# Patient Record
Sex: Male | Born: 1955
Health system: Southern US, Community
[De-identification: ages and names within clinical notes are randomized; demographics above are authoritative.]

## PROBLEM LIST (undated history)

## (undated) DIAGNOSIS — I10 Essential (primary) hypertension: Secondary | ICD-10-CM

## (undated) HISTORY — PX: OTHER SURGICAL HISTORY: SHX169

## (undated) HISTORY — DX: Essential (primary) hypertension: I10

---

## 2002-12-05 ENCOUNTER — Ambulatory Visit (HOSPITAL_BASED_OUTPATIENT_CLINIC_OR_DEPARTMENT_OTHER): Admission: RE | Admit: 2002-12-05 | Discharge: 2002-12-05 | Payer: Self-pay | Admitting: Internal Medicine

## 2003-03-01 ENCOUNTER — Ambulatory Visit (HOSPITAL_BASED_OUTPATIENT_CLINIC_OR_DEPARTMENT_OTHER): Admission: RE | Admit: 2003-03-01 | Discharge: 2003-03-01 | Payer: Self-pay | Admitting: Plastic Surgery

## 2003-03-01 ENCOUNTER — Ambulatory Visit (HOSPITAL_COMMUNITY): Admission: RE | Admit: 2003-03-01 | Discharge: 2003-03-01 | Payer: Self-pay | Admitting: Plastic Surgery

## 2005-10-02 ENCOUNTER — Encounter: Admission: RE | Admit: 2005-10-02 | Discharge: 2005-12-31 | Payer: Self-pay | Admitting: Family Medicine

## 2012-04-21 ENCOUNTER — Ambulatory Visit (INDEPENDENT_AMBULATORY_CARE_PROVIDER_SITE_OTHER): Payer: Self-pay | Admitting: General Surgery

## 2012-05-20 ENCOUNTER — Ambulatory Visit (INDEPENDENT_AMBULATORY_CARE_PROVIDER_SITE_OTHER): Payer: Self-pay | Admitting: General Surgery

## 2012-06-24 ENCOUNTER — Ambulatory Visit (INDEPENDENT_AMBULATORY_CARE_PROVIDER_SITE_OTHER): Payer: Self-pay | Admitting: General Surgery

## 2012-08-22 ENCOUNTER — Ambulatory Visit (INDEPENDENT_AMBULATORY_CARE_PROVIDER_SITE_OTHER): Payer: Self-pay | Admitting: General Surgery

## 2012-10-19 ENCOUNTER — Ambulatory Visit (INDEPENDENT_AMBULATORY_CARE_PROVIDER_SITE_OTHER): Payer: Self-pay | Admitting: General Surgery

## 2012-10-19 ENCOUNTER — Encounter (INDEPENDENT_AMBULATORY_CARE_PROVIDER_SITE_OTHER): Payer: Self-pay | Admitting: General Surgery

## 2012-10-19 VITALS — BP 130/78 | HR 64 | Resp 14 | Ht 69.0 in | Wt 264.2 lb

## 2012-10-19 DIAGNOSIS — K429 Umbilical hernia without obstruction or gangrene: Secondary | ICD-10-CM

## 2012-10-19 NOTE — Progress Notes (Signed)
Patient ID: Chad Schneider, male   DOB: [birth year], 57 y.o.   MRN: [id number]  Chief Complaint  Patient presents with  . New Evaluation    eval umb hernia    HPI ETHELBERT Schneider is a 57 y.o. male.  Referred by Dr Freddrick March HPI This is a 57 year old male who is otherwise fairly healthy who presents after being referred by Dr. Freddrick March.  He has a two-year history of an enlarging umbilical mass that is now always out. The serious gotten increasingly larger as well as more tender over time. It is bothering him while he is working. He has no trouble with his bowel movements although he does have some abdominal pain from it. He is a prior colonoscopy in the last 10 years which he said some polyps but was otherwise normal. He has no nausea or vomiting. He comes in today to discuss surgery as he thinks it is getting worse. Past Medical History  Diagnosis Date  . Hypertension     Past Surgical History  Procedure Laterality Date  . Reset femur      History reviewed. No pertinent family history.  Social History History  Substance Use Topics  . Smoking status: Former Smoker    Types: Cigarettes    Quit date: 10/19/1977  . Smokeless tobacco: Never Used  . Alcohol Use: Yes    No Known Allergies  Current Outpatient Prescriptions  Medication Sig Dispense Refill  . co-enzyme Q-10 30 MG capsule Take 30 mg by mouth 3 (three) times daily.      . fish oil-omega-3 fatty acids 1000 MG capsule Take 2 g by mouth daily.      Chad Schneider Oil 1000 MG CAPS Take by mouth.      . lansoprazole (PREVACID) 15 MG capsule Take 15 mg by mouth daily.      Chad Schneider lisinopril (PRINIVIL,ZESTRIL) 10 MG tablet Take 10 mg by mouth daily.      . Vitamin D, Ergocalciferol, (DRISDOL) 50000 UNITS CAPS capsule Take 50,000 Units by mouth.       No current facility-administered medications for this visit.    Review of Systems Review of Systems  Constitutional: Negative for fever, chills and unexpected weight change.  HENT:  Negative for hearing loss, congestion, sore throat, trouble swallowing and voice change.   Eyes: Negative for visual disturbance.  Respiratory: Negative for cough and wheezing.   Cardiovascular: Negative for chest pain, palpitations and leg swelling.  Gastrointestinal: Negative for nausea, vomiting, abdominal pain, diarrhea, constipation, blood in stool, abdominal distention, anal bleeding and rectal pain.  Genitourinary: Negative for hematuria and difficulty urinating.  Musculoskeletal: Negative for arthralgias.  Skin: Negative for rash and wound.  Neurological: Negative for seizures, syncope, weakness and headaches.  Hematological: Negative for adenopathy. Does not bruise/bleed easily.  Psychiatric/Behavioral: Negative for confusion.    Blood pressure 130/78, pulse 64, resp. rate 14, height 5\' 9"  (1.753 m), weight 264 lb 3.2 oz (119.84 kg).  Physical Exam Physical Exam  Vitals reviewed. Constitutional: He appears well-developed and well-nourished.  Cardiovascular: Normal rate, regular rhythm and normal heart sounds.   Pulmonary/Chest: Effort normal and breath sounds normal. He has no wheezes.  Abdominal: Soft. Bowel sounds are normal. He exhibits no distension. There is no tenderness. A hernia (nonreducible mildly tender umbilical hernia) is present.      Assessment    Umbilical hernia     Plan    We discussed the options including an open versus a laparoscopic umbilical  hernia repair. I think that the best option for him to prevent recurrence due to his body habitus will be a laparoscopic umbilical hernia repair with a larger piece of mesh. I discussed the pros and cons of both an open versus a laparoscopic repair. We are going to end up doing a laparoscopic repair. We discussed the risks of this as well as the time he will need to be off of work. He would like to wait until he has a job that is completed. He would like to try wait until January I think this is most likely  reasonable. I gave him some warning signs to call sooner if he has any trouble before then otherwise we'll plan on fixing this in January. He will call me about a month before and it scheduled.       Chad Schneider 10/19/2012, 12:21 PM

## 2015-09-03 ENCOUNTER — Emergency Department (HOSPITAL_COMMUNITY): Payer: Self-pay

## 2015-09-03 ENCOUNTER — Encounter (HOSPITAL_COMMUNITY)
Admission: EM | Disposition: A | Discharge status: Home / Self Care | Payer: Self-pay | Source: Home / Self Care | Attending: Emergency Medicine

## 2015-09-03 ENCOUNTER — Observation Stay (HOSPITAL_COMMUNITY): Payer: Self-pay | Admitting: Certified Registered Nurse Anesthetist

## 2015-09-03 ENCOUNTER — Encounter (HOSPITAL_COMMUNITY): Payer: Self-pay | Admitting: Emergency Medicine

## 2015-09-03 ENCOUNTER — Observation Stay (HOSPITAL_COMMUNITY)
Admission: EM | Admit: 2015-09-03 | Discharge: 2015-09-04 | Disposition: A | Discharge status: Home / Self Care | Payer: Self-pay | Attending: Surgery | Admitting: Surgery

## 2015-09-03 DIAGNOSIS — K429 Umbilical hernia without obstruction or gangrene: Secondary | ICD-10-CM

## 2015-09-03 DIAGNOSIS — G4730 Sleep apnea, unspecified: Secondary | ICD-10-CM | POA: Insufficient documentation

## 2015-09-03 DIAGNOSIS — E669 Obesity, unspecified: Secondary | ICD-10-CM | POA: Insufficient documentation

## 2015-09-03 DIAGNOSIS — I10 Essential (primary) hypertension: Secondary | ICD-10-CM | POA: Insufficient documentation

## 2015-09-03 DIAGNOSIS — Z87442 Personal history of urinary calculi: Secondary | ICD-10-CM | POA: Insufficient documentation

## 2015-09-03 DIAGNOSIS — Z6839 Body mass index (BMI) 39.0-39.9, adult: Secondary | ICD-10-CM | POA: Insufficient documentation

## 2015-09-03 DIAGNOSIS — Z87891 Personal history of nicotine dependence: Secondary | ICD-10-CM | POA: Insufficient documentation

## 2015-09-03 DIAGNOSIS — K420 Umbilical hernia with obstruction, without gangrene: Principal | ICD-10-CM | POA: Diagnosis present

## 2015-09-03 DIAGNOSIS — K219 Gastro-esophageal reflux disease without esophagitis: Secondary | ICD-10-CM | POA: Insufficient documentation

## 2015-09-03 DIAGNOSIS — Z79899 Other long term (current) drug therapy: Secondary | ICD-10-CM | POA: Insufficient documentation

## 2015-09-03 HISTORY — PX: UMBILICAL HERNIA REPAIR: SHX196

## 2015-09-03 LAB — COMPREHENSIVE METABOLIC PANEL
ALT: 31 U/L (ref 17–63)
AST: 20 U/L (ref 15–41)
Albumin: 4.6 g/dL (ref 3.5–5.0)
Alkaline Phosphatase: 77 U/L (ref 38–126)
Anion gap: 8 (ref 5–15)
BILIRUBIN TOTAL: 0.7 mg/dL (ref 0.3–1.2)
BUN: 13 mg/dL (ref 6–20)
CHLORIDE: 106 mmol/L (ref 101–111)
CO2: 23 mmol/L (ref 22–32)
CREATININE: 0.87 mg/dL (ref 0.61–1.24)
Calcium: 9.2 mg/dL (ref 8.9–10.3)
Glucose, Bld: 110 mg/dL — ABNORMAL HIGH (ref 65–99)
POTASSIUM: 4.1 mmol/L (ref 3.5–5.1)
Sodium: 137 mmol/L (ref 135–145)
TOTAL PROTEIN: 7.8 g/dL (ref 6.5–8.1)

## 2015-09-03 LAB — URINALYSIS, ROUTINE W REFLEX MICROSCOPIC
Bilirubin Urine: NEGATIVE
GLUCOSE, UA: NEGATIVE mg/dL
HGB URINE DIPSTICK: NEGATIVE
KETONES UR: NEGATIVE mg/dL
Leukocytes, UA: NEGATIVE
Nitrite: NEGATIVE
PROTEIN: NEGATIVE mg/dL
Specific Gravity, Urine: 1.009 (ref 1.005–1.030)
pH: 5.5 (ref 5.0–8.0)

## 2015-09-03 LAB — CBC WITH DIFFERENTIAL/PLATELET
Basophils Absolute: 0.1 10*3/uL (ref 0.0–0.1)
Basophils Relative: 1 %
EOS PCT: 2 %
Eosinophils Absolute: 0.1 10*3/uL (ref 0.0–0.7)
HEMATOCRIT: 45.1 % (ref 39.0–52.0)
Hemoglobin: 15.1 g/dL (ref 13.0–17.0)
LYMPHS ABS: 2.2 10*3/uL (ref 0.7–4.0)
LYMPHS PCT: 31 %
MCH: 29.7 pg (ref 26.0–34.0)
MCHC: 33.5 g/dL (ref 30.0–36.0)
MCV: 88.8 fL (ref 78.0–100.0)
MONO ABS: 0.7 10*3/uL (ref 0.1–1.0)
Monocytes Relative: 9 %
Neutro Abs: 4.1 10*3/uL (ref 1.7–7.7)
Neutrophils Relative %: 57 %
PLATELETS: 204 10*3/uL (ref 150–400)
RBC: 5.08 MIL/uL (ref 4.22–5.81)
RDW: 14 % (ref 11.5–15.5)
WBC: 7.1 10*3/uL (ref 4.0–10.5)

## 2015-09-03 LAB — LACTIC ACID, PLASMA: LACTIC ACID, VENOUS: 1.3 mmol/L (ref 0.5–1.9)

## 2015-09-03 SURGERY — REPAIR, HERNIA, UMBILICAL, LAPAROSCOPIC
Anesthesia: General

## 2015-09-03 MED ORDER — ROCURONIUM BROMIDE 100 MG/10ML IV SOLN
INTRAVENOUS | Status: DC | PRN
  Administered 2015-09-03: 30 mg via INTRAVENOUS
  Administered 2015-09-03: 10 mg via INTRAVENOUS

## 2015-09-03 MED ORDER — BUPIVACAINE-EPINEPHRINE (PF) 0.25% -1:200000 IJ SOLN
INTRAMUSCULAR | Status: AC
  Filled 2015-09-03: qty 30

## 2015-09-03 MED ORDER — ONDANSETRON 4 MG PO TBDP: 4.0000 mg | ORAL_TABLET | Freq: Four times a day (QID) | ORAL | Status: DC | PRN

## 2015-09-03 MED ORDER — SODIUM CHLORIDE 0.9 % IJ SOLN
INTRAMUSCULAR | Status: AC
  Filled 2015-09-03: qty 10

## 2015-09-03 MED ORDER — DEXAMETHASONE SODIUM PHOSPHATE 10 MG/ML IJ SOLN
INTRAMUSCULAR | Status: DC | PRN
  Administered 2015-09-03: 10 mg via INTRAVENOUS

## 2015-09-03 MED ORDER — ONDANSETRON HCL 4 MG/2ML IJ SOLN
INTRAMUSCULAR | Status: AC
  Filled 2015-09-03: qty 2

## 2015-09-03 MED ORDER — ROCURONIUM BROMIDE 100 MG/10ML IV SOLN
INTRAVENOUS | Status: AC
  Filled 2015-09-03: qty 1

## 2015-09-03 MED ORDER — HYDROCODONE-ACETAMINOPHEN 5-325 MG PO TABS
1.0000 | ORAL_TABLET | ORAL | Status: DC | PRN
  Administered 2015-09-03 – 2015-09-04 (×3): 2 via ORAL
  Filled 2015-09-03 (×3): qty 2

## 2015-09-03 MED ORDER — PROPOFOL 10 MG/ML IV BOLUS
INTRAVENOUS | Status: AC
  Filled 2015-09-03: qty 20

## 2015-09-03 MED ORDER — ONDANSETRON HCL 4 MG/2ML IJ SOLN: 4.0000 mg | Freq: Four times a day (QID) | INTRAMUSCULAR | Status: DC | PRN

## 2015-09-03 MED ORDER — LIDOCAINE HCL (CARDIAC) 20 MG/ML IV SOLN
INTRAVENOUS | Status: DC | PRN
  Administered 2015-09-03: 100 mg via INTRAVENOUS

## 2015-09-03 MED ORDER — MIDAZOLAM HCL 2 MG/2ML IJ SOLN
INTRAMUSCULAR | Status: AC
  Filled 2015-09-03: qty 2

## 2015-09-03 MED ORDER — LACTATED RINGERS IV SOLN
INTRAVENOUS | Status: DC | PRN
  Administered 2015-09-03 (×2): via INTRAVENOUS

## 2015-09-03 MED ORDER — SUGAMMADEX SODIUM 200 MG/2ML IV SOLN
INTRAVENOUS | Status: AC
  Filled 2015-09-03: qty 2

## 2015-09-03 MED ORDER — SODIUM CHLORIDE 0.9 % IV SOLN
INTRAVENOUS | Status: DC
  Administered 2015-09-03: 17:00:00 via INTRAVENOUS

## 2015-09-03 MED ORDER — CEFAZOLIN SODIUM-DEXTROSE 2-4 GM/100ML-% IV SOLN
INTRAVENOUS | Status: AC
  Filled 2015-09-03: qty 100

## 2015-09-03 MED ORDER — CEFAZOLIN SODIUM-DEXTROSE 2-4 GM/100ML-% IV SOLN
2.0000 g | INTRAVENOUS | Status: AC
  Filled 2015-09-03: qty 100

## 2015-09-03 MED ORDER — LISINOPRIL 20 MG PO TABS
40.0000 mg | ORAL_TABLET | Freq: Every day | ORAL | Status: DC
  Administered 2015-09-04: 40 mg via ORAL
  Filled 2015-09-03: qty 2

## 2015-09-03 MED ORDER — CEFAZOLIN SODIUM-DEXTROSE 2-4 GM/100ML-% IV SOLN
2.0000 g | INTRAVENOUS | Status: DC
  Administered 2015-09-03: 2 g via INTRAVENOUS

## 2015-09-03 MED ORDER — 0.9 % SODIUM CHLORIDE (POUR BTL) OPTIME
TOPICAL | Status: DC | PRN
  Administered 2015-09-03: 1000 mL

## 2015-09-03 MED ORDER — SUCCINYLCHOLINE CHLORIDE 20 MG/ML IJ SOLN
INTRAMUSCULAR | Status: DC | PRN
  Administered 2015-09-03: 200 mg via INTRAVENOUS

## 2015-09-03 MED ORDER — FENTANYL CITRATE (PF) 100 MCG/2ML IJ SOLN
INTRAMUSCULAR | Status: DC | PRN
  Administered 2015-09-03: 50 ug via INTRAVENOUS
  Administered 2015-09-03: 100 ug via INTRAVENOUS
  Administered 2015-09-03 (×2): 50 ug via INTRAVENOUS

## 2015-09-03 MED ORDER — PROMETHAZINE HCL 25 MG/ML IJ SOLN: 12.5000 mg | Freq: Once | INTRAMUSCULAR | Status: DC | PRN

## 2015-09-03 MED ORDER — MIDAZOLAM HCL 5 MG/5ML IJ SOLN
INTRAMUSCULAR | Status: DC | PRN
  Administered 2015-09-03: 2 mg via INTRAVENOUS

## 2015-09-03 MED ORDER — PROPOFOL 10 MG/ML IV BOLUS
INTRAVENOUS | Status: DC | PRN
  Administered 2015-09-03: 200 mg via INTRAVENOUS

## 2015-09-03 MED ORDER — DEXAMETHASONE SODIUM PHOSPHATE 10 MG/ML IJ SOLN
INTRAMUSCULAR | Status: AC
  Filled 2015-09-03: qty 1

## 2015-09-03 MED ORDER — FENTANYL CITRATE (PF) 250 MCG/5ML IJ SOLN
INTRAMUSCULAR | Status: AC
  Filled 2015-09-03: qty 5

## 2015-09-03 MED ORDER — FENTANYL CITRATE (PF) 100 MCG/2ML IJ SOLN
INTRAMUSCULAR | Status: AC
  Filled 2015-09-03: qty 2

## 2015-09-03 MED ORDER — LACTATED RINGERS IV SOLN: INTRAVENOUS | Status: DC

## 2015-09-03 MED ORDER — DIPHENHYDRAMINE HCL 50 MG/ML IJ SOLN: 12.5000 mg | Freq: Four times a day (QID) | INTRAMUSCULAR | Status: DC | PRN

## 2015-09-03 MED ORDER — SUGAMMADEX SODIUM 200 MG/2ML IV SOLN
INTRAVENOUS | Status: DC | PRN
  Administered 2015-09-03: 300 mg via INTRAVENOUS

## 2015-09-03 MED ORDER — EPHEDRINE SULFATE 50 MG/ML IJ SOLN
INTRAMUSCULAR | Status: AC
  Filled 2015-09-03: qty 1

## 2015-09-03 MED ORDER — MORPHINE SULFATE (PF) 2 MG/ML IV SOLN: 2.0000 mg | INTRAVENOUS | Status: DC | PRN

## 2015-09-03 MED ORDER — ONDANSETRON HCL 4 MG/2ML IJ SOLN
INTRAMUSCULAR | Status: DC | PRN
  Administered 2015-09-03: 4 mg via INTRAVENOUS

## 2015-09-03 MED ORDER — FAMOTIDINE IN NACL 20-0.9 MG/50ML-% IV SOLN
20.0000 mg | Freq: Two times a day (BID) | INTRAVENOUS | Status: DC
  Administered 2015-09-03 (×2): 20 mg via INTRAVENOUS
  Filled 2015-09-03 (×4): qty 50

## 2015-09-03 MED ORDER — METOPROLOL SUCCINATE ER 25 MG PO TB24: 25.0000 mg | ORAL_TABLET | Freq: Every day | ORAL | Status: DC

## 2015-09-03 MED ORDER — LIDOCAINE HCL (CARDIAC) 20 MG/ML IV SOLN
INTRAVENOUS | Status: AC
  Filled 2015-09-03: qty 5

## 2015-09-03 MED ORDER — FENTANYL CITRATE (PF) 100 MCG/2ML IJ SOLN
25.0000 ug | INTRAMUSCULAR | Status: DC | PRN
Start: 2015-09-03 — End: 2015-09-03
  Administered 2015-09-03 (×2): 50 ug via INTRAVENOUS

## 2015-09-03 MED ORDER — BUPIVACAINE-EPINEPHRINE 0.25% -1:200000 IJ SOLN
INTRAMUSCULAR | Status: DC | PRN
  Administered 2015-09-03: 30 mL

## 2015-09-03 MED ORDER — DIPHENHYDRAMINE HCL 12.5 MG/5ML PO ELIX: 12.5000 mg | ORAL_SOLUTION | Freq: Four times a day (QID) | ORAL | Status: DC | PRN

## 2015-09-03 SURGICAL SUPPLY — 44 items
APPLIER CLIP 5 13 M/L LIGAMAX5 (MISCELLANEOUS)
APPLIER CLIP ROT 10 11.4 M/L (STAPLE)
BINDER ABDOMINAL 12 ML 46-62 (SOFTGOODS) ×2 IMPLANT
CABLE HIGH FREQUENCY MONO STRZ (ELECTRODE) ×2 IMPLANT
CHLORAPREP W/TINT 26ML (MISCELLANEOUS) ×2 IMPLANT
CLIP APPLIE 5 13 M/L LIGAMAX5 (MISCELLANEOUS) IMPLANT
CLIP APPLIE ROT 10 11.4 M/L (STAPLE) IMPLANT
COVER SURGICAL LIGHT HANDLE (MISCELLANEOUS) ×2 IMPLANT
DECANTER SPIKE VIAL GLASS SM (MISCELLANEOUS) ×2 IMPLANT
DEVICE SECURE STRAP 25 ABSORB (INSTRUMENTS) IMPLANT
DEVICE TROCAR PUNCTURE CLOSURE (ENDOMECHANICALS) ×2 IMPLANT
DRAPE INCISE IOBAN 66X45 STRL (DRAPES) ×2 IMPLANT
DRAPE LAPAROSCOPIC ABDOMINAL (DRAPES) ×2 IMPLANT
ELECT COATED BLADE 2.86 ST (ELECTRODE) ×2 IMPLANT
ELECT REM PT RETURN 9FT ADLT (ELECTROSURGICAL) ×2
ELECTRODE REM PT RTRN 9FT ADLT (ELECTROSURGICAL) ×1 IMPLANT
GAUZE SPONGE 4X4 12PLY STRL (GAUZE/BANDAGES/DRESSINGS) ×2 IMPLANT
GLOVE SURG SIGNA 7.5 PF LTX (GLOVE) ×2 IMPLANT
GOWN STRL REUS W/TWL XL LVL3 (GOWN DISPOSABLE) ×6 IMPLANT
IRRIG SUCT STRYKERFLOW 2 WTIP (MISCELLANEOUS)
IRRIGATION SUCT STRKRFLW 2 WTP (MISCELLANEOUS) IMPLANT
KIT BASIN OR (CUSTOM PROCEDURE TRAY) ×2 IMPLANT
LIQUID BAND (GAUZE/BANDAGES/DRESSINGS) ×2 IMPLANT
NEEDLE SPNL 22GX3.5 QUINCKE BK (NEEDLE) ×2 IMPLANT
PAD POSITIONING PINK XL (MISCELLANEOUS) IMPLANT
POSITIONER SURGICAL ARM (MISCELLANEOUS) IMPLANT
SCISSORS LAP 5X35 DISP (ENDOMECHANICALS) ×2 IMPLANT
SHEARS HARMONIC ACE PLUS 36CM (ENDOMECHANICALS) IMPLANT
SLEEVE XCEL OPT CAN 5 100 (ENDOMECHANICALS) ×2 IMPLANT
STRIP CLOSURE SKIN 1/2X4 (GAUZE/BANDAGES/DRESSINGS) IMPLANT
SUT MNCRL AB 4-0 PS2 18 (SUTURE) ×2 IMPLANT
SUT NOVA NAB GS-21 0 18 T12 DT (SUTURE) ×4 IMPLANT
SUT NOVA T20/GS 25 (SUTURE) IMPLANT
SUT VIC AB 3-0 SH 18 (SUTURE) ×4 IMPLANT
TAPE CLOTH 3X10 WHT NS LF (GAUZE/BANDAGES/DRESSINGS) ×2 IMPLANT
TAPE CLOTH 4X10 WHT NS (GAUZE/BANDAGES/DRESSINGS) IMPLANT
TOWEL OR 17X26 10 PK STRL BLUE (TOWEL DISPOSABLE) ×2 IMPLANT
TOWEL OR NON WOVEN STRL DISP B (DISPOSABLE) ×2 IMPLANT
TRAY FOLEY W/METER SILVER 14FR (SET/KITS/TRAYS/PACK) IMPLANT
TRAY FOLEY W/METER SILVER 16FR (SET/KITS/TRAYS/PACK) IMPLANT
TRAY LAPAROSCOPIC (CUSTOM PROCEDURE TRAY) ×2 IMPLANT
TROCAR BLADELESS OPT 5 100 (ENDOMECHANICALS) ×2 IMPLANT
TROCAR XCEL NON-BLD 11X100MML (ENDOMECHANICALS) ×2 IMPLANT
TUBING INSUF HEATED (TUBING) ×2 IMPLANT

## 2015-09-03 NOTE — Anesthesia Postprocedure Evaluation (Signed)
Anesthesia Post Note  Patient: Chad Schneider  Procedure(s) Performed: Procedure(s) (LRB): DIAGNOSTIC LAPOROSCOPY,OPEN UMBILICAL HERNIA   REPAIR (N/A)  Patient location during evaluation: PACU Anesthesia Type: General Level of consciousness: awake and alert Pain management: pain level controlled Vital Signs Assessment: post-procedure vital signs reviewed and stable Respiratory status: spontaneous breathing, nonlabored ventilation and respiratory function stable Cardiovascular status: blood pressure returned to baseline and stable Postop Assessment: no signs of nausea or vomiting Anesthetic complications: no    Last Vitals:  Vitals:   09/03/15 1536 09/03/15 1546  BP:  (!) 160/87  Pulse: 85 85  Resp: 11 14  Temp:  36.7 C    Last Pain:  Vitals:   09/03/15 1520  TempSrc:   PainSc: 4                  Linton RumpJennifer Dickerson Kijana Estock

## 2015-09-03 NOTE — Anesthesia Preprocedure Evaluation (Addendum)
Anesthesia Evaluation  Patient identified by MRN, date of birth, ID band Patient awake    Reviewed: Allergy & Precautions, NPO status , Patient's Chart, lab work & pertinent test results  History of Anesthesia Complications Negative for: history of anesthetic complications  Airway Mallampati: II  TM Distance: >3 FB Neck ROM: Full    Dental  (+) Teeth Intact, Dental Advisory Given   Pulmonary neg shortness of breath, neg sleep apnea, neg COPD, neg recent URI, former smoker,    Pulmonary exam normal breath sounds clear to auscultation       Cardiovascular hypertension, Pt. on medications (-) angina(-) Past MI, (-) Cardiac Stents and (-) CHF  Rhythm:Regular Rate:Normal     Neuro/Psych negative neurological ROS     GI/Hepatic Neg liver ROS, GERD  Controlled,  Endo/Other  negative endocrine ROS  Renal/GU negative Renal ROS     Musculoskeletal   Abdominal (+) + obese,   Peds  Hematology negative hematology ROS (+)   Anesthesia Other Findings Incarcerated hernia  Reproductive/Obstetrics                           Anesthesia Physical Anesthesia Plan  ASA: II  Anesthesia Plan: General   Post-op Pain Management:    Induction: Intravenous  Airway Management Planned: Oral ETT  Additional Equipment:   Intra-op Plan:   Post-operative Plan: Extubation in OR  Informed Consent: I have reviewed the patients History and Physical, chart, labs and discussed the procedure including the risks, benefits and alternatives for the proposed anesthesia with the patient or authorized representative who has indicated his/her understanding and acceptance.   Dental advisory given  Plan Discussed with:   Anesthesia Plan Comments:       Anesthesia Quick Evaluation

## 2015-09-03 NOTE — H&P (Signed)
Chad Schneider is an 60 y.o. male.   PCP: Cari Caraway, MD  Chief Complaint: Umbilical hernia HPI: Patient is a 60 year old gentleman has had umbilical hernia for years he was actually evaluated by Dr. Donne Hazel 10/19/2012. Patient's been able to reduce this hernia will over the interim. Sunday 08/01/15 hernia appeared and was not reducible. He developed some nausea low-grade fever and diarrhea. This resolved on its own. He went to work yesterday and work throughout the day. The hernia  persists and he is still unable to reduce it. He presented to the emergency room for evaluation today. Workup in the ED shows he is afebrile blood pressure slightly elevated. Labs are normal.   Films show no evidence of bowel obstruction or free air there is no acute cardiopulmonary issues. The umbilical hernia is slightly erythematous, hard, minimally tender, and cannot be reduced by manual manipulation.                                                                                                                      Past Medical History:  Diagnosis Date  . Hypertension     Past Surgical History:  Procedure Laterality Date  . reset femur      No family history on file. Social History:  reports that he quit smoking about 37 years ago. His smoking use included Cigarettes. He has never used smokeless tobacco. He reports that he drinks alcohol. He reports that he does not use drugs. Tobacco 11 years quit in 1979.  EtOH: Social  Drugs: None since the teenage days.  Patient is single and lives alone. 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  Allergies: No Known Allergies  Prior to Admission medications   Medication Sig Start Date End Date Taking? Authorizing Provider  Cinnamon 500 MG capsule Take 500 mg by mouth daily.   Yes Historical Provider, MD  hydrochlorothiazide (MICROZIDE) 12.5 MG capsule Take 12.5 mg by mouth daily.   Yes Historical Provider, MD  Javier Docker Oil 1000 MG CAPS Take 1,000  mg by mouth daily.    Yes Historical Provider, MD  lansoprazole (PREVACID) 15 MG capsule Take 15 mg by mouth daily.   Yes Historical Provider, MD  lisinopril (PRINIVIL,ZESTRIL) 40 MG tablet Take 40 mg by mouth daily.   Yes Historical Provider, MD  metoprolol succinate (TOPROL-XL) 25 MG 24 hr tablet Take 25 mg by mouth at bedtime.   Yes Historical Provider, MD  Multiple Vitamins-Minerals (MULTIVITAMIN ADULT) TABS Take 1 tablet by mouth daily.   Yes Historical Provider, MD     Results for orders placed or performed during the hospital encounter of 09/03/15 (from the past 48 hour(s))  Urinalysis, Routine w reflex microscopic (not at [facility])     Status: None   Collection Time: 09/03/15 10:50 AM  Result Value Ref Range   Color, Urine YELLOW YELLOW   APPearance CLEAR CLEAR   Specific Gravity, Urine 1.009 1.005 - 1.030   pH 5.5 5.0 - 8.0   Glucose,  UA NEGATIVE NEGATIVE mg/dL   Hgb urine dipstick NEGATIVE NEGATIVE   Bilirubin Urine NEGATIVE NEGATIVE   Ketones, ur NEGATIVE NEGATIVE mg/dL   Protein, ur NEGATIVE NEGATIVE mg/dL   Nitrite NEGATIVE NEGATIVE   Leukocytes, UA NEGATIVE NEGATIVE    Comment: MICROSCOPIC NOT DONE ON URINES WITH NEGATIVE PROTEIN, BLOOD, LEUKOCYTES, NITRITE, OR GLUCOSE <1000 mg/dL.  CBC with Differential/Platelet     Status: None   Collection Time: 09/03/15 11:39 AM  Result Value Ref Range   WBC 7.1 4.0 - 10.5 K/uL   RBC 5.08 4.22 - 5.81 MIL/uL   Hemoglobin 15.1 13.0 - 17.0 g/dL   HCT 45.1 39.0 - 52.0 %   MCV 88.8 78.0 - 100.0 fL   MCH 29.7 26.0 - 34.0 pg   MCHC 33.5 30.0 - 36.0 g/dL   RDW 14.0 11.5 - 15.5 %   Platelets 204 150 - 400 K/uL   Neutrophils Relative % 57 %   Neutro Abs 4.1 1.7 - 7.7 K/uL   Lymphocytes Relative 31 %   Lymphs Abs 2.2 0.7 - 4.0 K/uL   Monocytes Relative 9 %   Monocytes Absolute 0.7 0.1 - 1.0 K/uL   Eosinophils Relative 2 %   Eosinophils Absolute 0.1 0.0 - 0.7 K/uL   Basophils Relative 1 %   Basophils Absolute 0.1 0.0 - 0.1 K/uL   Comprehensive metabolic panel     Status: Abnormal   Collection Time: 09/03/15 11:39 AM  Result Value Ref Range   Sodium 137 135 - 145 mmol/L   Potassium 4.1 3.5 - 5.1 mmol/L   Chloride 106 101 - 111 mmol/L   CO2 23 22 - 32 mmol/L   Glucose, Bld 110 (H) 65 - 99 mg/dL   BUN 13 6 - 20 mg/dL   Creatinine, Ser 0.87 0.61 - 1.24 mg/dL   Calcium 9.2 8.9 - 10.3 mg/dL   Total Protein 7.8 6.5 - 8.1 g/dL   Albumin 4.6 3.5 - 5.0 g/dL   AST 20 15 - 41 U/L   ALT 31 17 - 63 U/L   Alkaline Phosphatase 77 38 - 126 U/L   Total Bilirubin 0.7 0.3 - 1.2 mg/dL   GFR calc non Af Amer >60 >60 mL/min   GFR calc Af Amer >60 >60 mL/min    Comment: (NOTE) The eGFR has been calculated using the CKD EPI equation. This calculation has not been validated in all clinical situations. eGFR's persistently <60 mL/min signify possible Chronic Kidney Disease.    Anion gap 8 5 - 15  Lactic acid, plasma     Status: None   Collection Time: 09/03/15 11:39 AM  Result Value Ref Range   Lactic Acid, Venous 1.3 0.5 - 1.9 mmol/L   Dg Abdomen Acute W/chest  Result Date: 09/03/2015 CLINICAL DATA:  Pt has umbilical hernia. Lower abd pain. Causing pain for over a week. Nausea and almost vomited 2 days ago. No chest pains or SOB. No coughing. Pt takes HTN meds. Not diabetic. Quit smoking in 1979. Smoked only for a few years as a teenager. EXAM: DG ABDOMEN ACUTE W/ 1V CHEST COMPARISON:  None. FINDINGS: Normal bowel gas pattern.  No free air. No evidence of renal or ureteral stones. Cardiac silhouette is normal in size. Normal mediastinal and hilar contours. Clear lungs. Skeletal structures are demineralized. There are degenerative changes throughout the thoracolumbar spine. IMPRESSION: 1. No acute findings.  No evidence bowel obstruction or free air. 2. No acute cardiopulmonary disease. Electronically Signed   By: Shanon Brow  Ormond M.D.   On: 09/03/2015 11:50    Review of Systems  Constitutional: Negative.   HENT: Positive for  congestion (Occasional).   Eyes: Negative.   Respiratory: Negative for cough, hemoptysis, sputum production, shortness of breath and wheezing.        He has sleep apnea has been on CPAP for 17 years  Cardiovascular: Negative.   Gastrointestinal: Positive for abdominal pain (Umbilical site), diarrhea (He has some on Sunday 76/17), heartburn (He takes Prevacid daily for this.) and nausea (He had nausea on Sunday but no vomiting yesterday he went to work issues.). Negative for blood in stool, constipation, melena and vomiting.  Genitourinary: Negative.   Musculoskeletal: Negative.   Skin:       Umbilical hernia that is normally reducible has been hard and has not been able to reduce it since Sunday 08/01/15.  Neurological: Negative.   Endo/Heme/Allergies: Negative.   Psychiatric/Behavioral: Negative.     Blood pressure 136/99, pulse 69, temperature 98.6 F (37 C), temperature source Oral, resp. rate 20, SpO2 95 %. Physical Exam  Constitutional: He is oriented to person, place, and time. He appears well-developed and well-nourished. No distress.  69 inches/294 pounds  BMI is 39  HENT:  Head: Normocephalic.  Mouth/Throat: Oropharynx is clear and moist. No oropharyngeal exudate.  Eyes: Right eye exhibits no discharge. Left eye exhibits no discharge. No scleral icterus.  Neck: Normal range of motion. Neck supple. No JVD present. No tracheal deviation present. No thyromegaly present.  Cardiovascular: Normal rate, regular rhythm, normal heart sounds and intact distal pulses.   No murmur heard. Respiratory: Effort normal and breath sounds normal. No respiratory distress. He has no wheezes. He has no rales. He exhibits no tenderness.  GI: Soft. Bowel sounds are normal. He exhibits no distension and no mass. There is tenderness (Umbilical site). There is no rebound and no guarding.  4 x 4's 5 cm umbilical hernia is erythematous and indurated. He is not overly tender. He does not have peritonitis.   Musculoskeletal: He exhibits no edema or tenderness.  Lymphadenopathy:    He has no cervical adenopathy.  Neurological: He is alert and oriented to person, place, and time. No cranial nerve deficit.  Skin: Skin is dry. No rash noted. He is not diaphoretic. No erythema. No pallor.  Psychiatric: He has a normal mood and affect. His behavior is normal. Judgment and thought content normal.     Assessment/Plan Umbilical hernia, with probable omental incarceration. Hypertension Sleep apnea on CPAP 17 years BMI 39 Remote history of nephrolithiasis  Plan: Patient been seen and evaluated by Dr. Lucia Gaskins he plans to the operating room for umbilical hernia repair. He thinks he has some omentum within the hernia currently. They discussed options for a straight suture repair versus mesh. Dr. determine that at the time of surgery. Patient understands is agreeable with Dr. Pollie Friar recommendation.  JENNINGS,WILLARD, PA-C 09/03/2015, 12:20 PM   Agree with above. To the OR for umbilical hernia.  I discussed the indications and complications of hernia surgery with the patient.  I discussed both the laparoscopic and open approach to hernia repair..  The potential risks of hernia surgery include, but are not limited to, bleeding, infection, open surgery, nerve injury, and recurrence of the hernia.   Because the umbilicus is red and inflamed, I am unlikely to place permanent mesh.  Alphonsa Overall, MD, [facility] Pager: [phone] Office phone:  [phone]

## 2015-09-03 NOTE — ED Triage Notes (Signed)
Pt c/o umbilical hernia x several years, worsened this past week, new onset nausea, emesis, and increased size. In past patient was able to manually reduce hernia himself, now cannot. Hernia is hot, bulging, tender, non-reducible.

## 2015-09-03 NOTE — ED Notes (Signed)
Security at bedside for inventory of valuables

## 2015-09-03 NOTE — ED Notes (Signed)
Unable to collect labs at this time patient is not in the room 

## 2015-09-03 NOTE — Op Note (Signed)
09/03/2015  2:47 PM  PATIENT:  Chad Schneider, 60 y.o., male, MRN: [id number]  PREOP DIAGNOSIS:  incarcerated umbilical hernia   POSTOP DIAGNOSIS:   Incarcerated umbilical hernia with infarcted omentum.  PROCEDURE:   Procedure(s):  DIAGNOSTIC LAPOROSCOPY,OPEN UMBILICAL HERNIA  suture REPAIR  SURGEON:   Ovidio Kinavid Kelly Ranieri, M.D.  ASSISTANT:   NOne  ANESTHESIA:   general  Anesthesiologist: Linton RumpJennifer Dickerson Allan, MD CRNA: Minerva EndsAngela M Mirarchi, CRNA; Delphia GratesLoraine Chandler, CRNA  General  EBL:  minimal  ml  BLOOD ADMINISTERED: none  DRAINS: none   LOCAL MEDICATIONS USED:   30 cc 1/4% marcaine  SPECIMEN:   Umbilical hernia sac  COUNTS CORRECT:  YES  INDICATIONS FOR PROCEDURE:  Chad Schneider is a 60 y.o. (DOB: 06-14-1955) white male whose primary care physician is MCNEILL,WENDY, MD and comes for repair of incarcerated umbilical hernia.   [facility].  He now comes for repair.  I discussed both laparoscopic and open approaches.   The indications and risks of the surgery were explained to the patient.  The risks include, but are not limited to, infection, bleeding, and nerve injury.  Note dictated to:     The patient was taken [facility]. He was given a general endotracheal anesthetic. His abdomen was shaved and prepped and Ioban drape applied.   A timeout was held and surgical checklist run.   The hernia was not reducible, even with the patient asleep. I started out laparoscopically accessing the abdominal cavity with a 5 mm Ethicon trocar in the left upper quadrant. I placed a second 5 mm trocar in the left lateral abdomen.   I did an exploration which showed normal liver, stomach, and bowel that could be seen. He had a portion of his transverse colon omentum incarcerated in the umbilical hernia. I amputated this omentum with cautery.   Because of the redness around the incarcerated umbilical hernia and  the apparent necrosis of the omentum, I thought unwise to use permanent mesh for this hernia repair.    Therefore made a smiling infraumbilical incision with sharp dissection carried down to the abdominal wall fascia. I raised the skin of the umbilicus off the hernia sac the hernia mass was about 4 cm in diameter, with the neck of the hernia at about 2 cm.   I indicated the hernia sac, tied off the bleeders with 3-0 Vicryl suture, started the fascial closure. I used 0 Novafil and interrupted fashion to close the 2 cm fascial defect.   I then re-laparoscoped the patient to evaluate hernia repair was look good. There was no evidence of bowel injury. The trochars were then removed in turn. The subcutaneous tissues at the umbilicus were closed with 3-0 Vicryl suture. The skin was closed with a 4-0 Monocryl suture. The wounds were painted with with liquid band. A sterile pressure dressing was applied over the wounds and the patient was sent to the PACU with an abdominal binder.   His final sponge and needle count were correct.   Ovidio Kinavid Leyton Magoon, MD, [facility] Pager: [phone] Office phone:  [phone]

## 2015-09-03 NOTE — Anesthesia Procedure Notes (Signed)
Procedure Name: Intubation Date/Time: 09/03/2015 1:37 PM Performed by: Delphia GratesHANDLER, Sheyanne Munley Pre-anesthesia Checklist: Patient identified, Emergency Drugs available, Suction available and Patient being monitored Patient Re-evaluated:Patient Re-evaluated prior to inductionOxygen Delivery Method: Circle system utilized Preoxygenation: Pre-oxygenation with 100% oxygen Intubation Type: Cricoid Pressure applied and Rapid sequence Laryngoscope Size: Mac and 4 Grade View: Grade III Tube type: Oral Tube size: 7.5 mm Number of attempts: 1 Airway Equipment and Method: Bougie stylet Placement Confirmation: ETT inserted through vocal cords under direct vision,  breath sounds checked- equal and bilateral and positive ETCO2 Secured at: 22 cm Tube secured with: Tape Dental Injury: Teeth and Oropharynx as per pre-operative assessment  Comments: Glidescope available. Bougie passed easily.

## 2015-09-03 NOTE — ED Notes (Signed)
Patient transported to X-ray 

## 2015-09-03 NOTE — ED Provider Notes (Signed)
[facility]-EMERGENCY DEPT Provider Note   CSN: [id number] Arrival date & time: 09/03/15  4098  First Provider Contact:  None       History   Chief Complaint Chief Complaint  Patient presents with  . Hernia    HPI Chad Schneider is a 60 y.o. male.  HPI Pt c/o umbilical hernia x several years, worsened this past week, new onset nausea, emesis, and increased size. In past patient was able to manually reduce hernia himself, now cannot. Hernia is hot, bulging, tender, non-reducible.   Past Medical History:  Diagnosis Date  . Hypertension     There are no active problems to display for this patient.   Past Surgical History:  Procedure Laterality Date  . reset femur         Home Medications    Prior to Admission medications   Medication Sig Start Date End Date Taking? Authorizing Provider  lisinopril (PRINIVIL,ZESTRIL) 40 MG tablet Take 40 mg by mouth daily.   Yes Historical Provider, MD  Multiple Vitamins-Minerals (MULTIVITAMIN ADULT) TABS Take 1 tablet by mouth daily.   Yes Historical Provider, MD  co-enzyme Q-10 30 MG capsule Take 30 mg by mouth 3 (three) times daily.    Historical Provider, MD  fish oil-omega-3 fatty acids 1000 MG capsule Take 2 g by mouth daily.    Historical Provider, MD  [name] 1000 MG CAPS Take by mouth.    Historical Provider, MD  lansoprazole (PREVACID) 15 MG capsule Take 15 mg by mouth daily.    Historical Provider, MD    Family History No family history on file.  Social History Social History  Substance Use Topics  . Smoking status: Former Smoker    Types: Cigarettes    Quit date: 10/19/1977  . Smokeless tobacco: Never Used  . Alcohol use Yes     Allergies   Review of patient's allergies indicates no known allergies.   Review of Systems Review of Systems All other systems reviewed and are negative  Physical Exam Updated Vital Signs BP 136/99   Pulse 69   Temp 98.6 F (37 C) (Oral)   Resp 20   SpO2 95%   Physical  Exam  Physical Exam  Nursing note and vitals reviewed. Constitutional: He is oriented to person, place, and time. He appears well-developed and well-nourished. No distress.  HENT:  Head: Normocephalic and atraumatic.  Eyes: Pupils are equal, round, and reactive to light.  Neck: Normal range of motion.  Cardiovascular: Normal rate and intact distal pulses.   Pulmonary/Chest: No respiratory distress.  Abdominal: Normal appearance.  Patient with incarcerated umbilical hernia.  Attempt to reduce the hernia was unsuccessful.  Bowel sounds are present on auscultation.  No rebound or guarding tenderness present. Musculoskeletal: Normal range of motion.  Neurological: He is alert and oriented to person, place, and time. No cranial nerve deficit.  Skin: Skin is warm and dry. No rash noted.  Psychiatric: He has a normal mood and affect. His behavior is normal.   ED Treatments / Results  Labs (all labs ordered are listed, but only abnormal results are displayed) Labs Reviewed  CBC WITH DIFFERENTIAL/PLATELET  URINALYSIS, ROUTINE W REFLEX MICROSCOPIC (NOT AT [facility])  COMPREHENSIVE METABOLIC PANEL  LACTIC ACID, PLASMA    EKG  EKG Interpretation None       Radiology Dg Abdomen Acute W/chest  Result Date: 09/03/2015 CLINICAL DATA:  Pt has umbilical hernia. Lower abd pain. Causing pain for over a week. Nausea and almost vomited  2 days ago. No chest pains or SOB. No coughing. Pt takes HTN meds. Not diabetic. Quit smoking in 1979. Smoked only for a few years as a teenager. EXAM: DG ABDOMEN ACUTE W/ 1V CHEST COMPARISON:  None. FINDINGS: Normal bowel gas pattern.  No free air. No evidence of renal or ureteral stones. Cardiac silhouette is normal in size. Normal mediastinal and hilar contours. Clear lungs. Skeletal structures are demineralized. There are degenerative changes throughout the thoracolumbar spine. IMPRESSION: 1. No acute findings.  No evidence bowel obstruction or free air. 2. No acute  cardiopulmonary disease. Electronically Signed   By: Amie Portlandavid  Ormond M.D.   On: 09/03/2015 11:50    Procedures Procedures (including critical care time)  Medications Ordered in ED Medications - No data to display   Initial Impression / Assessment and Plan / ED Course  I have reviewed the triage vital signs and the nursing notes.  Pertinent labs & imaging results that were available during my care of the patient were reviewed by me and considered in my medical decision making (see chart for details).  Clinical Course   Patient was seen and evaluated by general surgery.  Plan section the operating room for definitive repair and treatment.   Final Clinical Impressions(s) / ED Diagnoses   Final diagnoses:  Recurrent umbilical hernia    New Prescriptions New Prescriptions   No medications on file     Chad Nayobert Anyae Griffith, MD 09/03/15 1212

## 2015-09-03 NOTE — Progress Notes (Signed)
RT placed patient on CPAP. Patient home setting is 11 cmH2O. Sterile water was added to water chamber for humidification. Patient is tolerating well. RT will continue to monitor.

## 2015-09-03 NOTE — ED Notes (Signed)
General Surgery at bedside.

## 2015-09-03 NOTE — ED Notes (Signed)
Consent completed at bedside by Dr. Ezzard StandingNewman  NT beginning CHG BATH

## 2015-09-03 NOTE — Transfer of Care (Signed)
Immediate Anesthesia Transfer of Care Note  Patient: Chad Schneider  Procedure(s) Performed: Procedure(s): DIAGNOSTIC LAPOROSCOPY,OPEN UMBILICAL HERNIA   REPAIR (N/A)  Patient Location: PACU  Anesthesia Type:General  Level of Consciousness: sedated  Airway & Oxygen Therapy: Patient Spontanous Breathing and Patient connected to face mask oxygen  Post-op Assessment: Report given to RN and Post -op Vital signs reviewed and stable  Post vital signs: Reviewed and stable  Last Vitals:  Vitals:   09/03/15 1108 09/03/15 1144  BP: 151/98 136/99  Pulse: 62 69  Resp: 20 20  Temp:      Last Pain:  Vitals:   09/03/15 0933  TempSrc: Oral         Complications: No apparent anesthesia complications

## 2015-09-04 LAB — CBC
HEMATOCRIT: 43 % (ref 39.0–52.0)
HEMOGLOBIN: 14.4 g/dL (ref 13.0–17.0)
MCH: 29.6 pg (ref 26.0–34.0)
MCHC: 33.5 g/dL (ref 30.0–36.0)
MCV: 88.5 fL (ref 78.0–100.0)
PLATELETS: 213 10*3/uL (ref 150–400)
RBC: 4.86 MIL/uL (ref 4.22–5.81)
RDW: 13.9 % (ref 11.5–15.5)
WBC: 14.3 10*3/uL — ABNORMAL HIGH (ref 4.0–10.5)

## 2015-09-04 LAB — BASIC METABOLIC PANEL
ANION GAP: 5 (ref 5–15)
BUN: 12 mg/dL (ref 6–20)
CHLORIDE: 105 mmol/L (ref 101–111)
CO2: 27 mmol/L (ref 22–32)
Calcium: 9 mg/dL (ref 8.9–10.3)
Creatinine, Ser: 0.85 mg/dL (ref 0.61–1.24)
GFR calc non Af Amer: >60 mL/min (ref >60)
GLUCOSE: 141 mg/dL — AB (ref 65–99)
POTASSIUM: 4.4 mmol/L (ref 3.5–5.1)
Sodium: 137 mmol/L (ref 135–145)

## 2015-09-04 MED ORDER — IBUPROFEN 200 MG PO TABS: 600.0000 mg | ORAL_TABLET | Freq: Four times a day (QID) | ORAL | Status: DC | PRN

## 2015-09-04 MED ORDER — HYDROCHLOROTHIAZIDE 12.5 MG PO CAPS: 12.5000 mg | ORAL_CAPSULE | Freq: Every day | ORAL | Status: DC

## 2015-09-04 MED ORDER — IBUPROFEN 200 MG PO TABS: ORAL_TABLET | ORAL | Status: AC

## 2015-09-04 MED ORDER — HYDROCODONE-ACETAMINOPHEN 5-325 MG PO TABS: 1.0000 | ORAL_TABLET | ORAL | 0 refills | Status: DC | PRN

## 2015-09-04 NOTE — Progress Notes (Signed)
1 Day Post-Op  Subjective: He looks good, has been avoiding multiple times. Pain is tolerable with by mouth pain medication. He is currently not on a diet. Site looks fine. The binder is rather large for him.  Objective: Vital signs in last 24 hours: Temp:  [97.8 F (36.6 C)-98.6 F (37 C)] 97.8 F (36.6 C) (08/09 0555) Pulse Rate:  [62-93] 80 (08/09 0555) Resp:  [11-20] 18 (08/09 0555) BP: (135-161)/(79-105) 135/79 (08/09 0555) SpO2:  [95 %-100 %] 95 % (08/09 0555) Last BM Date:  (prior to admission) No diet Afebrile vital signs are stable WBC up some  Intake/Output from previous day: 08/08 0701 - 08/09 0700 In: 2500 [I.V.:2500] Out: 50 [Blood:50] Intake/Output this shift: No intake/output data recorded.  General appearance: alert, cooperative and no distress Resp: clear to auscultation bilaterally GI: Soft, sore sites all look fine.  Lab Results:   Recent Labs  09/03/15 1139 09/04/15 0550  WBC 7.1 14.3*  HGB 15.1 14.4  HCT 45.1 43.0  PLT 204 213    BMET  Recent Labs  09/03/15 1139 09/04/15 0550  NA 137 137  K 4.1 4.4  CL 106 105  CO2 23 27  GLUCOSE 110* 141*  BUN 13 12  CREATININE 0.87 0.85  CALCIUM 9.2 9.0   PT/INR No results for input(s): LABPROT, INR in the last 72 hours.   Recent Labs Lab 09/03/15 1139  AST 20  ALT 31  ALKPHOS 77  BILITOT 0.7  PROT 7.8  ALBUMIN 4.6     Lipase  No results found for: LIPASE   Studies/Results: Dg Abdomen Acute W/chest  Result Date: 09/03/2015 CLINICAL DATA:  Pt has umbilical hernia. Lower abd pain. Causing pain for over a week. Nausea and almost vomited 2 days ago. No chest pains or SOB. No coughing. Pt takes HTN meds. Not diabetic. Quit smoking in 1979. Smoked only for a few years as a teenager. EXAM: DG ABDOMEN ACUTE W/ 1V CHEST COMPARISON:  None. FINDINGS: Normal bowel gas pattern.  No free air. No evidence of renal or ureteral stones. Cardiac silhouette is normal in size. Normal mediastinal and hilar  contours. Clear lungs. Skeletal structures are demineralized. There are degenerative changes throughout the thoracolumbar spine. IMPRESSION: 1. No acute findings.  No evidence bowel obstruction or free air. 2. No acute cardiopulmonary disease. Electronically Signed   By: Amie Portlandavid  Ormond M.D.   On: 09/03/2015 11:50    Medications: . famotidine (PEPCID) IV  20 mg Intravenous Q12H  . lisinopril  40 mg Oral Daily  . metoprolol succinate  25 mg Oral QHS   . sodium chloride 125 mL/hr at 09/03/15 1723    Hypertension Sleep apnea on CPAP 17 years BMI 39 Remote history of nephrolithiasis  Assessment/Plan Incarcerated umbilical hernia with infarcted omentum  Status post diagnostic cystic laparoscopy open umbilical hernia and suture repair, 09/03/15 Dr. Ovidio Kinavid Alden Bensinger FEN:  No diet/IV fluids ID:  Preop cefazolin only DVT:  SCD   Plan: Regular diet, mobilize, saline lock IV, the does well home after lunch.  LOS: 0 days    JENNINGS,WILLARD 09/04/2015 [phone]  Agree with above. He is ready to go home.  Ovidio Kinavid Larnce Schnackenberg, MD, [facility] Pager: [phone] Office phone:  [phone]

## 2015-09-04 NOTE — Discharge Instructions (Signed)
[facility] _______[facility]  UMBILICAL OR INGUINAL HERNIA REPAIR: POST OP INSTRUCTIONS  Always review your discharge instruction sheet given to you by the facility where your surgery was performed. IF YOU HAVE DISABILITY OR FAMILY LEAVE FORMS, YOU MUST BRING THEM TO THE OFFICE FOR PROCESSING.   DO NOT GIVE THEM TO YOUR DOCTOR.  1. A  prescription for pain medication may be given to you upon discharge.  Take your pain medication as prescribed, if needed.  If narcotic pain medicine is not needed, then you may take acetaminophen (Tylenol) or ibuprofen (Advil) as needed. 2. Take your usually prescribed medications unless otherwise directed. 3. If you need a refill on your pain medication, please contact your pharmacy.  They will contact our office to request authorization. Prescriptions will not be filled after 5 pm or on week-ends. 4. You should follow a light diet the first 24 hours after arrival home, such as soup and crackers, etc.  Be sure to include lots of fluids daily.  Resume your normal diet the day after surgery. 5. Most patients will experience some swelling and bruising around the umbilicus or in the groin and scrotum.  Ice packs and reclining will help.  Swelling and bruising can take several days to resolve.  6. It is common to experience some constipation if taking pain medication after surgery.  Increasing fluid intake and taking a stool softener (such as Colace) will usually help or prevent this problem from occurring.  A mild laxative (Milk of Magnesia or Miralax) should be taken according to package directions if there are no bowel movements after 48 hours. 7. Unless discharge instructions indicate otherwise, you may remove your bandages 24-48 hours after surgery, and you may shower at that time.  You may have steri-strips (small skin tapes) in place directly over the incision.  These strips should be left on the skin for 7-10 days.  If your surgeon used skin glue on the  incision, you may shower in 24 hours.  The glue will flake off over the next 2-3 weeks.  Any sutures or staples will be removed at the office during your follow-up visit. 8. ACTIVITIES:  You may resume regular (light) daily activities beginning the next day--such as daily self-care, walking, climbing stairs--gradually increasing activities as tolerated.  You may have sexual intercourse when it is comfortable.  Refrain from any heavy lifting or straining until approved by your doctor. a. You may drive when you are no longer taking prescription pain medication, you can comfortably wear a seatbelt, and you can safely maneuver your car and apply brakes. b. RETURN TO WORK:  __________________________________________________________ 9. You should see your doctor in the office for a follow-up appointment approximately 2-3 weeks after your surgery.  Make sure that you call for this appointment within a day or two after you arrive home to insure a convenient appointment time. 10. OTHER INSTRUCTIONS:  __________________________________________________________________________________________________________________________________________________________________________________________  WHEN TO CALL YOUR DOCTOR: 1. Fever over 101.0 2. Inability to urinate 3. Nausea and/or vomiting 4. Extreme swelling or bruising 5. Continued bleeding from incision. 6. Increased pain, redness, or drainage from the incision  The clinic staff is available to answer your questions during regular business hours.  Please don't hesitate to call and ask to speak to one of the nurses for clinical concerns.  If you have a medical emergency, go to the nearest emergency room or call 911.  A surgeon from [facility] is always on call at the hospital     [address], [address], [place], [place]  [address] ?  [address] [address], [place], [place]   [address] [phone] ? [phone] ? FAX [phone] Web site:  [email or web address]  

## 2015-09-04 NOTE — Progress Notes (Signed)
Pt discharged from the unit. Discharge instructions were reviewed with the pt and family members. No questions or concerns from the pt at this time.  Juliann Olesky W Drayce Tawil, RN

## 2015-09-09 NOTE — Discharge Summary (Signed)
Physician Discharge Summary  Patient ID: Chad Schneider MRN: [id number] DOB/AGE: 1955/02/12 60 y.o.  Admit date: 09/03/2015 Discharge date: 09/04/2015  Admission Diagnoses:  Umbilical hernia, with probable omental incarceration. Hypertension Sleep apnea on CPAP 17 years BMI 39 Remote history of nephrolithiasis Discharge Diagnoses:   Incarcerated umbilical hernia with infarcted omentum. Hypertension Sleep apnea on CPAP 17 years BMI 39 Remote history of nephrolithiasis   Active Problems:   Irreducible umbilical hernia   PROCEDURES:            Status post diagnostic cystic laparoscopy open umbilical hernia and suture repair, 09/03/15 Dr. Alfredia Fergusonavid Marvis Bakken  Hospital Course: Patient is a 60 year old gentleman has had umbilical hernia for years he was actually evaluated by Dr. Dwain SarnaWakefield 10/19/2012. Patient's been able to reduce this hernia will over the interim. Sunday 08/01/15 hernia appeared and was not reducible. He developed some nausea low-grade fever and diarrhea. This resolved on its own. He went to work yesterday and work throughout the day. The hernia  persists and he is still unable to reduce it. He presented to the emergency room for evaluation today. Workup in the ED shows he is afebrile blood pressure slightly elevated. Labs are normal.   Films show no evidence of bowel obstruction or free air there is no acute cardiopulmonary issues. The umbilical hernia is slightly erythematous, hard, minimally tender, and cannot be reduced by manual manipulation.  Pt was seen in the ED and taken to the OR later that day.  He tolerated the procedure well.  He returned to the floor and was tolerating a diet the following AM.   He had a binder that was rather large, but this was adjusted and he was discharged home.    Condition on d/c:  Improved    CBC Latest Ref Rng & Units 09/04/2015 09/03/2015  WBC 4.0 - 10.5 K/uL 14.3(H) 7.1  Hemoglobin 13.0 - 17.0 g/dL 62.114.4 30.815.1  Hematocrit 65.739.0 - 52.0 % 43.0 45.1   Platelets 150 - 400 K/uL 213 204                                                                                                                 CMP Latest Ref Rng & Units 09/04/2015 09/03/2015  Glucose 65 - 99 mg/dL 846(N141(H) 629(B110(H)  BUN 6 - 20 mg/dL 12 13  Creatinine 2.840.61 - 1.24 mg/dL 1.320.85 4.400.87  Sodium 102135 - 145 mmol/L 137 137  Potassium 3.5 - 5.1 mmol/L 4.4 4.1  Chloride 101 - 111 mmol/L 105 106  CO2 22 - 32 mmol/L 27 23  Calcium 8.9 - 10.3 mg/dL 9.0 9.2  Total Protein 6.5 - 8.1 g/dL - 7.8  Total Bilirubin 0.3 - 1.2 mg/dL - 0.7  Alkaline Phos 38 - 126 U/L - 77  AST 15 - 41 U/L - 20  ALT 17 - 63 U/L - 31    Disposition: 01-Home or Self Care     Medication List    TAKE these medications   Cinnamon 500 MG capsule Take  500 mg by mouth daily.   hydrochlorothiazide 12.5 MG capsule Commonly known as:  MICROZIDE Take 12.5 mg by mouth daily.   HYDROcodone-acetaminophen 5-325 MG tablet Commonly known as:  NORCO/VICODIN Take 1-2 tablets by mouth every 4 (four) hours as needed for moderate pain.   ibuprofen 200 MG tablet Commonly known as:  ADVIL,MOTRIN You can take 2-3 tablets every 6 hours as needed and use this is her first line pain medication. Use the narcotic as second line pain management medication.   Krill Oil 1000 MG Caps Take 1,000 mg by mouth daily.   lansoprazole 15 MG capsule Commonly known as:  PREVACID Take 15 mg by mouth daily.   lisinopril 40 MG tablet Commonly known as:  PRINIVIL,ZESTRIL Take 40 mg by mouth daily.   metoprolol succinate 25 MG 24 hr tablet Commonly known as:  TOPROL-XL Take 25 mg by mouth at bedtime.   MULTIVITAMIN ADULT Tabs Take 1 tablet by mouth daily.      Follow-up Information    Luiscarlos Kaczmarczyk H, MD .   Specialty:  General Surgery Why:  Cough for follow-up appointment in 2-3 weeks. No lifting over 10 pounds for 4 weeks. Contact information: [address] [place] [address] [phone]            Signed: Sherrie GeorgeJENNINGS,WILLARD 09/09/2015, 4:18 PM  Agree with above.  Ovidio Kinavid Maynard Esmerelda Finnigan, MD, [facility] Pager: [phone] Office phone:  [phone]

## 2016-01-01 ENCOUNTER — Ambulatory Visit (INDEPENDENT_AMBULATORY_CARE_PROVIDER_SITE_OTHER): Payer: Self-pay

## 2016-01-01 ENCOUNTER — Ambulatory Visit (INDEPENDENT_AMBULATORY_CARE_PROVIDER_SITE_OTHER): Payer: Self-pay | Admitting: Orthopedic Surgery

## 2016-01-01 ENCOUNTER — Encounter (INDEPENDENT_AMBULATORY_CARE_PROVIDER_SITE_OTHER): Payer: Self-pay | Admitting: Orthopedic Surgery

## 2016-01-01 DIAGNOSIS — M25562 Pain in left knee: Secondary | ICD-10-CM | POA: Insufficient documentation

## 2016-01-01 MED ORDER — METHYLPREDNISOLONE ACETATE 40 MG/ML IJ SUSP
40.0000 mg | INTRAMUSCULAR | Status: AC | PRN
  Administered 2016-01-01: 40 mg via INTRA_ARTICULAR

## 2016-01-01 MED ORDER — BUPIVACAINE HCL 0.25 % IJ SOLN
4.0000 mL | INTRAMUSCULAR | Status: AC | PRN
  Administered 2016-01-01: 4 mL via INTRA_ARTICULAR

## 2016-01-01 MED ORDER — LIDOCAINE HCL 1 % IJ SOLN
5.0000 mL | INTRAMUSCULAR | Status: AC | PRN
  Administered 2016-01-01: 5 mL

## 2016-01-01 NOTE — Progress Notes (Signed)
Office Visit Note   Patient: Chad Schneider           Date of Birth: Jul 05, 1955           MRN: [id number] Visit Date: 01/01/2016 Requested by: Gweneth DimitriWendy McNeill, MD [address] [place], [place] [address] PCP: Gweneth DimitriMCNEILL,WENDY, MD  Subjective: Chief Complaint  Patient presents with  . 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  He has a had a femur fracture age 60 treated in traction.  Was doing reasonably well with that injury until he stepped in a hole 6 weeks ago.  He injured the knee at that time and has been having pain since then.  In taking Norco at night only occasionally as well as ibuprofen occasionally.  Denies much in the way of mechanical symptoms.  He is still active in his business which requires a lot of physical effort              Review of Systems All systems reviewed are negative as they relate to the chief complaint within the history of present illness.  Patient denies  fevers or chills.    Assessment & Plan: Visit Diagnoses:  1. Acute pain of left knee     Plan: Impression is exacerbation of left knee arthritis following an injury 6 weeks ago.  There is no fracture present on radiographs.  He does have a small effusion.  Plan is aspiration injection of the left knee today which is performed.  Continue with anti-inflammatories.  Could consider gel injection if this doesn't work.  I'll see him back as needed  Follow-Up Instructions: No Follow-up on file.   Orders:  Orders Placed This Encounter  Procedures  . XR KNEE 3 VIEW LEFT   No orders of the defined types were placed in this encounter.     Procedures: Large Joint Inj Date/Time: 01/01/2016 12:15 PM Performed by: Cammy CopaEAN, SCOTT Shawnice Tilmon Authorized by: Cammy CopaEAN, SCOTT Djibril Glogowski   Consent Given by:  Patient Site marked: the procedure site was marked   Timeout: prior to procedure the correct patient, procedure, and  site was verified   Indications:  Pain, joint swelling and diagnostic evaluation Location:  Knee Site:  L knee Prep: patient was prepped and draped in usual sterile fashion   Needle Size:  18 G Needle Length:  1.5 inches Approach:  Superolateral Ultrasound Guidance: No   Fluoroscopic Guidance: No   Arthrogram: No   Medications:  5 mL lidocaine 1 %; 4 mL bupivacaine 0.25 %; 40 mg methylPREDNISolone acetate 40 MG/ML Aspiration Attempted: Yes   Aspirate amount (mL):  10 Aspirate:  Clear Patient tolerance:  Patient tolerated the procedure well with no immediate complications     Clinical Data: No additional findings.  Objective: Vital Signs: There were no vitals taken for this visit.  Physical Exam   Constitutional: Patient appears well-developed HEENT:  Head: Normocephalic Eyes:EOM are normal Neck: Normal range of motion Cardiovascular: Normal rate Pulmonary/chest: Effort normal Neurologic: Patient is alert Skin: Skin is warm Psychiatric: Patient has normal mood and affect    Ortho Exam examination of the left knee demonstrates trace effusion mild varus alignment palpable pedal pulses no nerve retention signs.  No groin pain with internal/external rotation of the leg.  He is a little less flexion on the left compared to the right little bit more hyperextension left compared to right compatible  with his distal femoral deformity.  Collaterals are stable cruciates are stable.  He has mild medial joint line tenderness present.  Specialty Comments:  No specialty comments available.  Imaging: Xr Knee 3 View Left  Result Date: 01/01/2016 AP lateral merchant view left knee reviewed.  On the AP there is joint space narrowing and varus alignment.  Joint space narrowing is prominent medially.  Merchant view shows mild degenerative patellofemoral arthritis but maintenance of the joint space is present.  On the lateral there is evidence of prior distal femur fracture which is healed.   Again is noted approximately 1 mm of joint space in the medial compartment.  Soft tissues otherwise are unremarkable.  Fracture of the distal femur has fully healed and some remodeling is present.    PMFS History: Patient Active Problem List   Diagnosis Date Noted  . Acute pain of left knee 01/01/2016  . Irreducible umbilical hernia 09/03/2015   Past Medical History:  Diagnosis Date  . Hypertension     No family history on file.  Past Surgical History:  Procedure Laterality Date  . reset femur    . UMBILICAL HERNIA REPAIR N/A 09/03/2015   Procedure: DIAGNOSTIC LAPOROSCOPY,OPEN UMBILICAL HERNIA   REPAIR;  Surgeon: Ovidio Kinavid Newman, MD;  Location: [facility]S;  Service: General;  Laterality: N/A;   Social History   Occupational History  . Not on file.   Social History Main Topics  . Smoking status: Former Smoker    Types: Cigarettes    Quit date: 10/19/1977  . Smokeless tobacco: Never Used  . Alcohol use Yes  . Drug use: No  . Sexual activity: Yes

## 2017-12-01 ENCOUNTER — Ambulatory Visit (INDEPENDENT_AMBULATORY_CARE_PROVIDER_SITE_OTHER): Payer: Self-pay | Admitting: Family Medicine

## 2017-12-01 ENCOUNTER — Encounter (INDEPENDENT_AMBULATORY_CARE_PROVIDER_SITE_OTHER): Payer: Self-pay | Admitting: Family Medicine

## 2017-12-01 VITALS — BP 154/92 | HR 60 | Temp 97.4°F | Ht 69.0 in | Wt 228.0 lb

## 2017-12-01 DIAGNOSIS — R109 Unspecified abdominal pain: Secondary | ICD-10-CM

## 2017-12-01 MED ORDER — TAMSULOSIN HCL 0.4 MG PO CAPS: 0.4000 mg | ORAL_CAPSULE | Freq: Every day | ORAL | 0 refills | Status: AC

## 2017-12-01 MED ORDER — SULFAMETHOXAZOLE-TRIMETHOPRIM 800-160 MG PO TABS: 1.0000 | ORAL_TABLET | Freq: Two times a day (BID) | ORAL | 0 refills | Status: DC

## 2017-12-01 NOTE — Progress Notes (Signed)
   Office Visit Note   Patient: Chad Schneider           Date of Birth: 03-08-1955           MRN: [id number] Visit Date: 12/01/2017 Requested by: Gweneth Dimitri, MD [address] [place], [place] [address] PCP: Gweneth Dimitri, MD  Subjective: Chief Complaint  Patient presents with  . rt lower back pain with intermittent fevers, started last wk    HPI: He is here with right-sided flank pain.  Symptoms started last week.  History of kidney stones in the past.  He feels like he has one again.  Intermittent fever and chills, no hematuria.  Seems to be a little bit better today but still not back to normal.              ROS: Otherwise noncontributory  Objective: Vital Signs: BP (!) 154/92 (BP Location: Left Arm, Patient Position: Sitting, Cuff Size: Large)   Pulse 60   Temp (!) 97.4 F (36.3 C)   Ht 5\' 9"  (1.753 m)   Wt 228 lb (103.4 kg)   SpO2 99%   BMI 33.67 kg/m   Physical Exam:  Right flank: No visible rash, slightly tender in the CVA.  Imaging: None today.  Assessment & Plan: 1.  Right-sided flank pain with chills, suspicious for nephrolithiasis and possible infection -Bactrim, Flomax.  Keep hydrated.  If symptoms worsen, then possible CT scan and urology referral.   Follow-Up Instructions: No follow-ups on file.      Procedures: No procedures performed  No notes on file    PMFS History: Patient Active Problem List   Diagnosis Date Noted  . Acute pain of left knee 01/01/2016  . Irreducible umbilical hernia 09/03/2015   Past Medical History:  Diagnosis Date  . Hypertension     History reviewed. No pertinent family history.  Past Surgical History:  Procedure Laterality Date  . reset femur    . UMBILICAL HERNIA REPAIR N/A 09/03/2015   Procedure: DIAGNOSTIC LAPOROSCOPY,OPEN UMBILICAL HERNIA   REPAIR;  Surgeon: Ovidio Kin, MD;  Location: [facility];  Service: General;  Laterality: N/A;   Social History   Occupational History  . Not on file  Tobacco Use    . Smoking status: Former Smoker    Types: Cigarettes    Last attempt to quit: 10/19/1977    Years since quitting: 40.1  . Smokeless tobacco: Never Used  Substance and Sexual Activity  . Alcohol use: Yes  . Drug use: No  . Sexual activity: Yes

## 2018-05-24 ENCOUNTER — Other Ambulatory Visit (INDEPENDENT_AMBULATORY_CARE_PROVIDER_SITE_OTHER): Payer: Self-pay | Admitting: Family Medicine

## 2018-05-24 MED ORDER — AMOXICILLIN-POT CLAVULANATE 875-125 MG PO TABS: 1.0000 | ORAL_TABLET | Freq: Two times a day (BID) | ORAL | 0 refills | Status: DC

## 2018-05-24 MED ORDER — METHYLPREDNISOLONE 4 MG PO TBPK: ORAL_TABLET | ORAL | 0 refills | Status: DC

## 2018-05-24 NOTE — Progress Notes (Signed)
I spoke to the patient.  He has had sinus troubles for the past 5 or 6 months.  Couple months ago he went to the dentist and had x-rays showing a left-sided sinus infection.  He was treated with antibiotics for couple weeks and he felt well, but now he is having left-sided congestion again.  No shortness of breath or cough, no fevers or chills.  We will treat with antibiotics and a Medrol Dosepak.  He will follow-up if symptoms persist or worsen.

## 2018-08-01 ENCOUNTER — Other Ambulatory Visit: Payer: Self-pay

## 2018-08-01 ENCOUNTER — Ambulatory Visit (INDEPENDENT_AMBULATORY_CARE_PROVIDER_SITE_OTHER): Payer: Medicare Other | Admitting: Family Medicine

## 2018-08-01 ENCOUNTER — Encounter: Payer: Self-pay | Admitting: Family Medicine

## 2018-08-01 DIAGNOSIS — G8929 Other chronic pain: Secondary | ICD-10-CM

## 2018-08-01 DIAGNOSIS — R0981 Nasal congestion: Secondary | ICD-10-CM

## 2018-08-01 DIAGNOSIS — M25561 Pain in right knee: Secondary | ICD-10-CM

## 2018-08-01 DIAGNOSIS — M25562 Pain in left knee: Secondary | ICD-10-CM | POA: Diagnosis not present

## 2018-08-01 MED ORDER — HYDROCODONE-ACETAMINOPHEN 5-325 MG PO TABS: 1.0000 | ORAL_TABLET | Freq: Every evening | ORAL | 0 refills | Status: DC | PRN

## 2018-08-01 MED ORDER — PREDNISONE 10 MG PO TABS: ORAL_TABLET | ORAL | 0 refills | Status: DC

## 2018-08-01 NOTE — Progress Notes (Signed)
Office Visit Note   Patient: Chad Schneider           Date of Birth: 1955/03/09           MRN: [id number] Visit Date: 08/01/2018 Requested by: Gweneth DimitriMcNeill, Wendy, MD [address] [place],  [place] [id number] PCP: Gweneth DimitriMcNeill, Wendy, MD  Subjective: Chief Complaint  Patient presents with  . bilateral knee pain, left greater right  . sinus congestion - intermittent x years    HPI: He is here with 2 concerns.  He is a head congestion for at least 6 months, cannot breathe well from either nostril.  He really thinks he has had troubles for a couple years.  Never has any fevers or purulent drainage.  He is tried a variety of nasal steroids, Benadryl, other antihistamines and sinus washes.  Nothing really seems to help.  He plans to make an appointment with his ENT doctor in a few weeks.  He has bilateral knee osteoarthritis and both of them bother him a lot.  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                ROS: No fevers or chills.  No shortness of breath.  All other systems were reviewed and are negative.  Objective: Vital Signs: There were no vitals taken for this visit.  Physical Exam:  General:  Alert and oriented, in no acute distress. Pulm:  Breathing unlabored. Psy:  Normal mood, congruent affect. Skin: No visible rash. HEENT:  [place]/AT, PERRLA, EOM Full, no nystagmus.  Funduscopic examination within normal limits.  No conjunctival erythema.  Tympanic membranes are pearly gray with normal landmarks.  External ear canals are normal.  Nasal passages are clear.  Oropharynx is clear.  No significant lymphadenopathy.  No thyromegaly or nodules.  2+ carotid pulses without bruits. CV: Regular rate and rhythm without murmurs, rubs, or gallops.  No peripheral edema.  2+ radial and posterior tibial pulses. Lungs: Clear to auscultation throughout with no wheezing or areas of consolidation. Knees: 2+ patellofemoral crepitus, no effusion in either knee  today.  Slightly tender on the medial and lateral joint lines.  Good range of motion.   Imaging: None today.  Assessment & Plan: 1.  Chronic nasal congestion, etiology uncertain.  Could be allergies. -Short-term use of oral prednisone.  Follow-up with ENT.  He may need CT scan.  2.  Bilateral knee pain with history of osteoarthritis -Prednisone Dosepak.  Hydrocodone only for severe pain at night.  Could consider injections in the future.     Procedures: No procedures performed  No notes on file     PMFS History: Patient Active Problem List   Diagnosis Date Noted  . Acute pain of left knee 01/01/2016  . Irreducible umbilical hernia 09/03/2015   Past Medical History:  Diagnosis Date  . Hypertension     History reviewed. No pertinent family history.  Past Surgical History:  Procedure Laterality Date  . reset femur    . UMBILICAL HERNIA REPAIR N/A 09/03/2015   Procedure: DIAGNOSTIC LAPOROSCOPY,OPEN UMBILICAL HERNIA   REPAIR;  Surgeon: Ovidio Kinavid Newman, MD;  Location: [facility];  Service: General;  Laterality: N/A;   Social History   Occupational History  . Not on file  Tobacco Use  . Smoking status: Former Smoker    Types: Cigarettes    Quit date: 10/19/1977    Years since quitting: 40.8  . Smokeless tobacco: Never Used  Substance and Sexual Activity  . Alcohol use: Yes  . Drug use: No  . Sexual activity: Yes

## 2018-11-04 DIAGNOSIS — K219 Gastro-esophageal reflux disease without esophagitis: Secondary | ICD-10-CM | POA: Insufficient documentation

## 2018-11-04 DIAGNOSIS — J3489 Other specified disorders of nose and nasal sinuses: Secondary | ICD-10-CM | POA: Insufficient documentation

## 2018-11-04 DIAGNOSIS — T485X5A Adverse effect of other anti-common-cold drugs, initial encounter: Secondary | ICD-10-CM | POA: Insufficient documentation

## 2018-11-04 DIAGNOSIS — G4733 Obstructive sleep apnea (adult) (pediatric): Secondary | ICD-10-CM | POA: Insufficient documentation

## 2018-11-04 DIAGNOSIS — J310 Chronic rhinitis: Secondary | ICD-10-CM | POA: Insufficient documentation

## 2018-11-04 DIAGNOSIS — H9113 Presbycusis, bilateral: Secondary | ICD-10-CM | POA: Insufficient documentation

## 2019-01-18 ENCOUNTER — Ambulatory Visit (INDEPENDENT_AMBULATORY_CARE_PROVIDER_SITE_OTHER): Payer: Medicare Other | Admitting: Family Medicine

## 2019-01-18 ENCOUNTER — Encounter: Payer: Self-pay | Admitting: Family Medicine

## 2019-01-18 ENCOUNTER — Other Ambulatory Visit: Payer: Self-pay

## 2019-01-18 DIAGNOSIS — M25551 Pain in right hip: Secondary | ICD-10-CM | POA: Diagnosis not present

## 2019-01-18 MED ORDER — PREDNISONE 10 MG PO TABS: ORAL_TABLET | ORAL | 0 refills | Status: DC

## 2019-01-18 MED ORDER — BACLOFEN 10 MG PO TABS: 5.0000 mg | ORAL_TABLET | Freq: Every evening | ORAL | 3 refills | Status: DC | PRN

## 2019-01-18 NOTE — Progress Notes (Signed)
   Office Visit Note   Patient: Chad Schneider           Date of Birth: Dec 11, 1955           MRN: [id number] Visit Date: 01/18/2019 Requested by: Cari Caraway, [place],  [place] [address] PCP: Cari Caraway, MD  Subjective: Chief Complaint  Patient presents with  . Lower Back - Pain    Intermittent right lower back pain in the past. This flareup is over on the left side as well, x 1 month. Sharp, stabbing pain when he starts to stand up.    HPI: He is here with right posterior hip pain.  Longstanding intermittent problems with this area related to an old left femur shaft fracture if it healed in a shortened position causing his gait to be altered.  Over the years he has had good results with chiropractic adjustments during flareups but his previous chiropractor went out of business due to COVID-19.  For about a month he has had pain and now he is having some discomfort on the left side.  Pain does not radiate down the leg.  He has a TENS unit at home.                ROS: No fevers or chills, no bowel or bladder dysfunction.  All other systems were reviewed and are negative.  Objective: Vital Signs: There were no vitals taken for this visit.  Physical Exam:  General:  Alert and oriented, in no acute distress. Pulm:  Breathing unlabored. Psy:  Normal mood, congruent affect. Skin: No rash. Low back: He is tender near the right SI joint and in the gluteus medius area.  Negative straight leg raise, no pain with internal hip rotation.  Lower extremity strength and reflexes are normal.  Imaging: None today  Assessment & Plan: 1.  Right posterior hip pain, possibly SI dysfunction -Prednisone taper, baclofen as needed.  Chiropractic per Dr. Purcell Nails or else physical therapy at [facility] in [place]. -Lumbar x-rays if symptoms persist.     Procedures: No procedures performed  No notes on file     PMFS History: Patient Active Problem List   Diagnosis  Date Noted  . Laryngopharyngeal reflux (LPR) 11/04/2018  . Nasal obstruction 11/04/2018  . Obstructive sleep apnea 11/04/2018  . Presbycusis of both ears 11/04/2018  . Rhinitis medicamentosa 11/04/2018  . Acute pain of left knee 01/01/2016  . Irreducible umbilical hernia 37/90/2409   Past Medical History:  Diagnosis Date  . Hypertension     History reviewed. No pertinent family history.  Past Surgical History:  Procedure Laterality Date  . reset femur    . UMBILICAL HERNIA REPAIR N/A 09/03/2015   Procedure: DIAGNOSTIC LAPOROSCOPY,OPEN UMBILICAL HERNIA   REPAIR;  Surgeon: Alphonsa Overall, MD;  Location: [facility];  Service: General;  Laterality: N/A;   Social History   Occupational History  . Not on file  Tobacco Use  . Smoking status: Former Smoker    Types: Cigarettes    Quit date: 10/19/1977    Years since quitting: 41.2  . Smokeless tobacco: Never Used  Substance and Sexual Activity  . Alcohol use: Yes  . Drug use: No  . Sexual activity: Yes

## 2019-05-20 ENCOUNTER — Other Ambulatory Visit: Payer: Self-pay | Admitting: Family Medicine

## 2019-06-09 ENCOUNTER — Ambulatory Visit (INDEPENDENT_AMBULATORY_CARE_PROVIDER_SITE_OTHER): Payer: Medicare Other | Admitting: Family Medicine

## 2019-06-09 ENCOUNTER — Encounter: Payer: Self-pay | Admitting: Family Medicine

## 2019-06-09 ENCOUNTER — Other Ambulatory Visit: Payer: Self-pay

## 2019-06-09 ENCOUNTER — Telehealth: Payer: Self-pay

## 2019-06-09 DIAGNOSIS — M1711 Unilateral primary osteoarthritis, right knee: Secondary | ICD-10-CM

## 2019-06-09 DIAGNOSIS — M25572 Pain in left ankle and joints of left foot: Secondary | ICD-10-CM | POA: Diagnosis not present

## 2019-06-09 DIAGNOSIS — G8929 Other chronic pain: Secondary | ICD-10-CM | POA: Diagnosis not present

## 2019-06-09 DIAGNOSIS — M1712 Unilateral primary osteoarthritis, left knee: Secondary | ICD-10-CM | POA: Diagnosis not present

## 2019-06-09 MED ORDER — HYDROCODONE-ACETAMINOPHEN 5-325 MG PO TABS: 1.0000 | ORAL_TABLET | Freq: Every evening | ORAL | 0 refills | Status: DC | PRN

## 2019-06-09 NOTE — Addendum Note (Signed)
Addended by: Lillia Carmel on: 06/09/2019 05:09 PM   Modules accepted: Orders

## 2019-06-09 NOTE — Progress Notes (Signed)
Office Visit Note   Patient: Chad Schneider           Date of Birth: 10-23-1955           MRN: [id number] Visit Date: 06/09/2019 Requested by: Gweneth Dimitri, MD [address] [place],  [place] [address] PCP: Gweneth Dimitri, MD  Subjective: Chief Complaint  Patient presents with  . Right Knee - Pain    Icing knees at night. Ibuprofen. 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  . Left Knee - Pain  . Left Foot - Pain    Pain in the heel x 5 weeks. Area of pain is plantar/medial heel. Worse when he is on his feet all day. Has tried ice at night and massage with a tennis ball. Some days are better than others.    HPI: Bilateral knee pain and left heel pain.  He has a history of osteoarthritis in his knees.  In the past he had a cortisone injection which helped for a while.  Both knees have been aching and throbbing by the end of the day.  No locking or giving way.  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  Is extremely painful to squat and kneel, and is difficult to get back up again.  It is also hard to start walking after being in a car for a while.  His left heel started hurting about a month ago.  He has a history of plantar fasciitis in the past, he thinks he has it again.               ROS:   All other systems were reviewed and are negative.  Objective: Vital Signs: There were no vitals taken for this visit.  Physical Exam:  General:  Alert and oriented, in no acute distress. Pulm:  Breathing unlabored. Psy:  Normal mood, congruent affect. Skin: No erythema or rash Knees: Trace effusion bilaterally with no warmth.  2+ patellofemoral crepitus in both knees.  Tender on the medial and lateral joint lines, no ligamentous laxity.  Range of motion is full extension to about 120 degrees flexion.   Left heel: Point tender at the medial origin of the plantar fascia.  No  pain with medial/lateral calcaneal squeeze.  Imaging: No results found.  Assessment & Plan: 1.  Bilateral knee osteoarthritis -Steroid injections today.  We will attempt to get approval for gel injections for future use. 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  2.  Left heel plantar fasciitis -Ice water soaks, stretching.  Stretching.     Procedures: Knee steroid injections: After sterile prep with Betadine, injected 5 cc 1% lidocaine without epinephrine and 40 mg methylprednisolone from lateral midpatellar approach on the left and medial midpatellar approach on the right.    PMFS History: Patient Active Problem List   Diagnosis Date Noted  . Laryngopharyngeal reflux (LPR) 11/04/2018  . Nasal obstruction 11/04/2018  . Obstructive sleep apnea 11/04/2018  . Presbycusis of both ears 11/04/2018  . Rhinitis medicamentosa 11/04/2018  . Acute pain of left knee 01/01/2016  . Irreducible umbilical hernia 09/03/2015   Past Medical History:  Diagnosis Date  . Hypertension     History reviewed. No pertinent family history.  Past Surgical History:  Procedure Laterality Date  . reset femur    . UMBILICAL HERNIA REPAIR N/A 09/03/2015   Procedure: DIAGNOSTIC LAPOROSCOPY,OPEN UMBILICAL HERNIA   REPAIR;  Surgeon: Alphonsa Overall, MD;  Location: [facility];  Service: General;  Laterality: N/A;   Social History   Occupational History  . Not on file  Tobacco Use  . Smoking status: Former Smoker    Types: Cigarettes    Quit date: 10/19/1977    Years since quitting: 41.6  . Smokeless tobacco: Never Used  Substance and Sexual Activity  . Alcohol use: Yes  . Drug use: No  . Sexual activity: Yes

## 2019-06-09 NOTE — Progress Notes (Signed)
Noted  

## 2019-06-09 NOTE — Telephone Encounter (Signed)
Submitted VOB for Monovisc, bilateral knee. 

## 2019-06-12 ENCOUNTER — Telehealth: Payer: Self-pay

## 2019-06-12 MED ORDER — HYDROCODONE-ACETAMINOPHEN 5-325 MG PO TABS: 1.0000 | ORAL_TABLET | Freq: Every evening | ORAL | 0 refills | Status: DC | PRN

## 2019-06-12 NOTE — Telephone Encounter (Signed)
Advised patient that he is approved for gel injection.  Patient stated that he will call back to schedule once he knows his work schedule for June.  Monovisc, bilateral knee. Buy & Bill Patient will be responsible for 20% OOP. No Co-pay No PA required

## 2019-06-12 NOTE — Addendum Note (Signed)
Addended by: Lillia Carmel on: 06/12/2019 07:58 AM   Modules accepted: Orders

## 2019-09-25 ENCOUNTER — Other Ambulatory Visit: Payer: Self-pay | Admitting: Family Medicine

## 2019-09-25 MED ORDER — BACLOFEN 10 MG PO TABS
ORAL_TABLET | ORAL | 6 refills | Status: DC
Start: 2019-09-25 — End: 2020-01-17

## 2020-01-17 ENCOUNTER — Encounter: Payer: Self-pay | Admitting: Family Medicine

## 2020-01-17 ENCOUNTER — Ambulatory Visit (INDEPENDENT_AMBULATORY_CARE_PROVIDER_SITE_OTHER): Payer: Medicare Other | Admitting: Family Medicine

## 2020-01-17 ENCOUNTER — Other Ambulatory Visit: Payer: Self-pay

## 2020-01-17 DIAGNOSIS — R3915 Urgency of urination: Secondary | ICD-10-CM

## 2020-01-17 DIAGNOSIS — M545 Low back pain, unspecified: Secondary | ICD-10-CM

## 2020-01-17 MED ORDER — PREDNISONE 10 MG PO TABS: ORAL_TABLET | ORAL | 0 refills | Status: DC

## 2020-01-17 MED ORDER — FINASTERIDE 5 MG PO TABS: 5.0000 mg | ORAL_TABLET | Freq: Every day | ORAL | 11 refills | Status: AC

## 2020-01-17 MED ORDER — BACLOFEN 10 MG PO TABS: ORAL_TABLET | ORAL | 6 refills | Status: DC

## 2020-01-17 NOTE — Progress Notes (Signed)
   Office Visit Note   Patient: Chad Schneider           Date of Birth: 1955/05/22           MRN: [id number] Visit Date: 01/17/2020 Requested by: Gweneth Dimitri, MD [address] [place],  [place] [address] PCP: Gweneth Dimitri, MD  Subjective: Chief Complaint  Patient presents with  . Lower Back - Pain    Pain flared up last week. Has subsided some since then. Pain is in the middle of the lower back (as has been in the past), but does hurt some on the left or right at times. No pain radiating down the legs, no numbness/tingling. Has not been able to go to the chiropractor nor massage therapist, as he has done in the past. 1 week ago, he did trip and fell against the piano - sharp pain in the anterior left ribs.    HPI: Is here with flareup of low back pain.  Symptoms started recently, about a week ago.  No radicular symptoms.  Very difficult to stand up straight when he is in this pain.  Last time I saw him for this he improved with prednisone and baclofen.  He has done well until recently.  He is still having urinary symptoms.  Flomax helps a little but not enough.  Having some urgency but no incontinence.                ROS:   All other systems were reviewed and are negative.  Objective: Vital Signs: There were no vitals taken for this visit.  Physical Exam:  General:  Alert and oriented, in no acute distress. Pulm:  Breathing unlabored. Psy:  Normal mood, congruent affect.  Low back: He is tender over the SI joints today, negative straight leg raise, lower extremity strength remains normal.    Imaging: No results found.  Assessment & Plan: 1.  Flareup of low back pain -Prednisone, baclofen.  Chiropractic or physical therapy if symptoms persist.  Could contemplate SI joint injection in the future if symptoms worsen.  2.  Probable BPH -We will add Proscar.  Consider urology consult if     Procedures: No procedures performed        PMFS History: Patient Active  Problem List   Diagnosis Date Noted  . Laryngopharyngeal reflux (LPR) 11/04/2018  . Nasal obstruction 11/04/2018  . Obstructive sleep apnea 11/04/2018  . Presbycusis of both ears 11/04/2018  . Rhinitis medicamentosa 11/04/2018  . Acute pain of left knee 01/01/2016  . Irreducible umbilical hernia 09/03/2015   Past Medical History:  Diagnosis Date  . Hypertension     History reviewed. No pertinent family history.  Past Surgical History:  Procedure Laterality Date  . reset femur    . UMBILICAL HERNIA REPAIR N/A 09/03/2015   Procedure: DIAGNOSTIC LAPOROSCOPY,OPEN UMBILICAL HERNIA   REPAIR;  Surgeon: Ovidio Kin, MD;  Location: [facility];  Service: General;  Laterality: N/A;   Social History   Occupational History  . Not on file  Tobacco Use  . Smoking status: Former Smoker    Types: Cigarettes    Quit date: 10/19/1977    Years since quitting: 42.2  . Smokeless tobacco: Never Used  Substance and Sexual Activity  . Alcohol use: Yes  . Drug use: No  . Sexual activity: Yes

## 2020-03-15 DIAGNOSIS — I10 Essential (primary) hypertension: Secondary | ICD-10-CM | POA: Diagnosis not present

## 2020-03-15 DIAGNOSIS — R1319 Other dysphagia: Secondary | ICD-10-CM | POA: Diagnosis not present

## 2020-03-15 DIAGNOSIS — E1159 Type 2 diabetes mellitus with other circulatory complications: Secondary | ICD-10-CM | POA: Diagnosis not present

## 2020-03-15 DIAGNOSIS — E782 Mixed hyperlipidemia: Secondary | ICD-10-CM | POA: Diagnosis not present

## 2020-03-15 DIAGNOSIS — N401 Enlarged prostate with lower urinary tract symptoms: Secondary | ICD-10-CM | POA: Diagnosis not present

## 2020-03-15 DIAGNOSIS — Z125 Encounter for screening for malignant neoplasm of prostate: Secondary | ICD-10-CM | POA: Diagnosis not present

## 2020-03-15 DIAGNOSIS — Z79899 Other long term (current) drug therapy: Secondary | ICD-10-CM | POA: Diagnosis not present

## 2020-03-15 DIAGNOSIS — K2100 Gastro-esophageal reflux disease with esophagitis, without bleeding: Secondary | ICD-10-CM | POA: Diagnosis not present

## 2020-04-04 DIAGNOSIS — Z86010 Personal history of colonic polyps: Secondary | ICD-10-CM | POA: Diagnosis not present

## 2020-04-04 DIAGNOSIS — K219 Gastro-esophageal reflux disease without esophagitis: Secondary | ICD-10-CM | POA: Diagnosis not present

## 2020-04-12 ENCOUNTER — Other Ambulatory Visit: Payer: Self-pay

## 2020-04-12 ENCOUNTER — Encounter: Payer: Self-pay | Admitting: Family Medicine

## 2020-04-12 ENCOUNTER — Ambulatory Visit (INDEPENDENT_AMBULATORY_CARE_PROVIDER_SITE_OTHER): Payer: PPO | Admitting: Family Medicine

## 2020-04-12 DIAGNOSIS — M545 Low back pain, unspecified: Secondary | ICD-10-CM | POA: Diagnosis not present

## 2020-04-12 DIAGNOSIS — N401 Enlarged prostate with lower urinary tract symptoms: Secondary | ICD-10-CM

## 2020-04-12 MED ORDER — HYDROCODONE-ACETAMINOPHEN 7.5-325 MG PO TABS: 0.5000 | ORAL_TABLET | Freq: Four times a day (QID) | ORAL | 0 refills | Status: DC | PRN

## 2020-04-12 NOTE — Progress Notes (Signed)
Office Visit Note   Patient: Chad Schneider           Date of Birth: 1955-02-21           MRN: [id number] Visit Date: 04/12/2020 Requested by: Gweneth Dimitri, MD [address] [place],  [place] [address] PCP: Gweneth Dimitri, MD  Subjective: Chief Complaint  Patient presents with  . Lower Back - Pain    The back pain flares up every time he does any physical work. 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     HPI: He is here with persistent low back pain.  Now his pain is in the left posterior hip/low back, making it difficult to stand up from a seated position and causing pain when twisting his body.  Baclofen did not help, hydrocodone does help but he tries to use it extremely sparingly.  He went to a different chiropractor but did not get the same results as his previous chiropractor who retired.  He also continues to have BPH symptoms.  Flomax was not helping, his PCP doubled his dosage and it still is not providing adequate relief of symptoms.  He is concerned that something more might be going on and would like a referral.              ROS:   All other systems were reviewed and are negative.  Objective: Vital Signs: There were no vitals taken for this visit.  Physical Exam:  General:  Alert and oriented, in no acute distress. Pulm:  Breathing unlabored. Psy:  Normal mood, congruent affect.  Low back: He has a tender trigger point just superior to the left SI joint that seems to reproduce his pain.  He does have some tenderness over both SI joints.  Imaging: No results found.  Assessment & Plan: 1.  Left low back/posterior hip pain, suspect myofascial pain.  Cannot rule out SI joint dysfunction. -Elected to inject the symptomatic trigger point today.  If this does not help, then I will refer him for x-rays and MRI scan of the lumbar spine.  2.  BPH -Referral to Dr. Laverle Patter.     Procedures: Left low back injection: After  sterile prep with alcohol, injected 3 cc 0.25% bupivacaine and 6 mg betamethasone.  He had excellent pain relief during the anesthetic phase.       PMFS History: Patient Active Problem List   Diagnosis Date Noted  . Laryngopharyngeal reflux (LPR) 11/04/2018  . Nasal obstruction 11/04/2018  . Obstructive sleep apnea 11/04/2018  . Presbycusis of both ears 11/04/2018  . Rhinitis medicamentosa 11/04/2018  . Acute pain of left knee 01/01/2016  . Irreducible umbilical hernia 09/03/2015   Past Medical History:  Diagnosis Date  . Hypertension     History reviewed. No pertinent family history.  Past Surgical History:  Procedure Laterality Date  . reset femur    . UMBILICAL HERNIA REPAIR N/A 09/03/2015   Procedure: DIAGNOSTIC LAPOROSCOPY,OPEN UMBILICAL HERNIA   REPAIR;  Surgeon: Ovidio Kin, MD;  Location: [facility];  Service: General;  Laterality: N/A;   Social History   Occupational History  . Not on file  Tobacco Use  . Smoking status: Former Smoker    Types: Cigarettes    Quit date: 10/19/1977    Years since quitting: 42.5  . Smokeless tobacco: Never Used  Substance and Sexual Activity  . Alcohol use: Yes  . Drug use: No  .  Sexual activity: Yes

## 2020-05-07 DIAGNOSIS — F4323 Adjustment disorder with mixed anxiety and depressed mood: Secondary | ICD-10-CM | POA: Diagnosis not present

## 2020-05-13 ENCOUNTER — Encounter: Payer: Self-pay | Admitting: Family Medicine

## 2020-05-13 ENCOUNTER — Ambulatory Visit (INDEPENDENT_AMBULATORY_CARE_PROVIDER_SITE_OTHER): Payer: PPO | Admitting: Family Medicine

## 2020-05-13 ENCOUNTER — Other Ambulatory Visit: Payer: Self-pay

## 2020-05-13 ENCOUNTER — Ambulatory Visit (INDEPENDENT_AMBULATORY_CARE_PROVIDER_SITE_OTHER): Payer: PPO

## 2020-05-13 DIAGNOSIS — M545 Low back pain, unspecified: Secondary | ICD-10-CM | POA: Diagnosis not present

## 2020-05-13 MED ORDER — DIAZEPAM 5 MG PO TABS: ORAL_TABLET | ORAL | 0 refills | Status: AC

## 2020-05-13 MED ORDER — HYDROCODONE-ACETAMINOPHEN 7.5-325 MG PO TABS: 0.5000 | ORAL_TABLET | Freq: Four times a day (QID) | ORAL | 0 refills | Status: AC | PRN

## 2020-05-13 NOTE — Progress Notes (Signed)
   Office Visit Note   Patient: Chad Schneider           Date of Birth: Mar 02, 1955           MRN: [id number] Visit Date: 05/13/2020 Requested by: Gweneth Dimitri, MD [address] [place],  [place] [address] PCP: Gweneth Dimitri, MD  Subjective: Chief Complaint  Patient presents with  . Lower Back - Pain    Persistent low back pain. "Can hardly walk normal" - the back "just locks up."    HPI: He is here with worsening back pain.  Occasional right-sided radicular pain but mostly in the midline.              ROS: No bowel or bladder dysfunction.  All other systems were reviewed and are negative.  Objective: Vital Signs: There were no vitals taken for this visit.  Physical Exam:  General:  Alert and oriented, in no acute distress. Pulm:  Breathing unlabored. Psy:  Normal mood, congruent affect.  Low back: Tender in the L5-S1 area across both sides.  Negative straight leg raise.  No sciatic notch tenderness.  Lower extremity strength is normal.    Imaging: XR Lumbar Spine 2-3 Views  Result Date: 05/13/2020 X-rays reveal diffuse degenerative changes specially at L5-S1 with disc space narrowing, bone spurring and facet arthropathy.  No sign of compression fracture or neoplasm.   Assessment & Plan: 1.  Worsening low back pain -Proceed with MRI scan.  Refilled medication.  Consider facet injections or epidural depending on the findings.     Procedures: No procedures performed        PMFS History: Patient Active Problem List   Diagnosis Date Noted  . Laryngopharyngeal reflux (LPR) 11/04/2018  . Nasal obstruction 11/04/2018  . Obstructive sleep apnea 11/04/2018  . Presbycusis of both ears 11/04/2018  . Rhinitis medicamentosa 11/04/2018  . Acute pain of left knee 01/01/2016  . Irreducible umbilical hernia 09/03/2015   Past Medical History:  Diagnosis Date  . Hypertension     History reviewed. No pertinent family history.  Past Surgical History:  Procedure  Laterality Date  . reset femur    . UMBILICAL HERNIA REPAIR N/A 09/03/2015   Procedure: DIAGNOSTIC LAPOROSCOPY,OPEN UMBILICAL HERNIA   REPAIR;  Surgeon: Ovidio Kin, MD;  Location: [facility];  Service: General;  Laterality: N/A;   Social History   Occupational History  . Not on file  Tobacco Use  . Smoking status: Former Smoker    Types: Cigarettes    Quit date: 10/19/1977    Years since quitting: 42.5  . Smokeless tobacco: Never Used  Substance and Sexual Activity  . Alcohol use: Yes  . Drug use: No  . Sexual activity: Yes

## 2020-05-20 ENCOUNTER — Ambulatory Visit (INDEPENDENT_AMBULATORY_CARE_PROVIDER_SITE_OTHER): Payer: PPO

## 2020-05-20 ENCOUNTER — Telehealth: Payer: Self-pay | Admitting: Family Medicine

## 2020-05-20 ENCOUNTER — Other Ambulatory Visit: Payer: Self-pay

## 2020-05-20 DIAGNOSIS — M545 Low back pain, unspecified: Secondary | ICD-10-CM

## 2020-05-20 DIAGNOSIS — G8929 Other chronic pain: Secondary | ICD-10-CM | POA: Diagnosis not present

## 2020-05-20 NOTE — Telephone Encounter (Signed)
MRI scan shows moderate spinal stenosis/narrowing of the spinal canal at L2-3 level along with severe narrowing of the left nerve opening at that level.  There is severe facet arthritis at L4-5 along with moderate narrowing of the spinal canal.  There is slippage of L5 on S1 due to bilateral pars defects and this results in narrowing of the nerve openings at that level.  Injections would definitely be an option.  Physical therapy would be another consideration.

## 2020-05-24 DIAGNOSIS — F4323 Adjustment disorder with mixed anxiety and depressed mood: Secondary | ICD-10-CM | POA: Diagnosis not present

## 2020-05-28 ENCOUNTER — Other Ambulatory Visit: Payer: Self-pay | Admitting: Family Medicine

## 2020-05-28 DIAGNOSIS — M545 Low back pain, unspecified: Secondary | ICD-10-CM

## 2020-05-30 ENCOUNTER — Telehealth: Payer: Self-pay | Admitting: Physical Medicine and Rehabilitation

## 2020-05-30 NOTE — Telephone Encounter (Signed)
Patient returned call asked for a call back    The number to contact patient is [phone]

## 2020-05-30 NOTE — Telephone Encounter (Signed)
done

## 2020-05-30 NOTE — Telephone Encounter (Signed)
Called pt and LVM #1 

## 2020-06-06 DIAGNOSIS — F4323 Adjustment disorder with mixed anxiety and depressed mood: Secondary | ICD-10-CM | POA: Diagnosis not present

## 2020-06-13 ENCOUNTER — Ambulatory Visit: Payer: Self-pay

## 2020-06-13 ENCOUNTER — Ambulatory Visit: Payer: PPO | Admitting: Physical Medicine and Rehabilitation

## 2020-06-13 ENCOUNTER — Other Ambulatory Visit: Payer: Self-pay

## 2020-06-13 ENCOUNTER — Encounter: Payer: Self-pay | Admitting: Physical Medicine and Rehabilitation

## 2020-06-13 VITALS — BP 134/86 | HR 66

## 2020-06-13 DIAGNOSIS — M47816 Spondylosis without myelopathy or radiculopathy, lumbar region: Secondary | ICD-10-CM | POA: Diagnosis not present

## 2020-06-13 MED ORDER — BUPIVACAINE HCL 0.5 % IJ SOLN
3.0000 mL | Freq: Once | INTRAMUSCULAR | Status: AC
  Administered 2020-06-13: 3 mL

## 2020-06-13 NOTE — Progress Notes (Signed)
Pt state lower back pain. Pt state any movement makes the pain worse. Pt state heat takes pain meds and uses heating pads to help ease his pain.  Numeric Pain Rating Scale and Functional Assessment Average Pain 6   In the last MONTH (on 0-10 scale) has pain interfered with the following?  1. General activity like being  able to carry out your everyday physical activities such as walking, climbing stairs, carrying groceries, or moving a chair?  Rating(8)   +Driver, -BT, -Dye Allergies.

## 2020-06-13 NOTE — Patient Instructions (Signed)

## 2020-06-28 DIAGNOSIS — F4323 Adjustment disorder with mixed anxiety and depressed mood: Secondary | ICD-10-CM | POA: Diagnosis not present

## 2020-07-01 NOTE — Progress Notes (Signed)
Chad Schneider - 65 y.o. male MRN [id number]  Date of birth: [birth year]  Office Visit Note: Visit Date: 06/13/2020 PCP: Gweneth Dimitri, MD Referred by: Gweneth Dimitri, MD  Subjective: Chief Complaint  Patient presents with  . Lower Back - Pain   HPI:  Chad Schneider is a 65 y.o. male who comes in today at the request of Dr. Lavada Mesi for planned Bilateral  L4-L5 and L5-S1 Lumbar facet/medial branch block with fluoroscopic guidance.  The patient has failed conservative care including home exercise, medications, time and activity modification.  This injection will be diagnostic and hopefully therapeutic.  Please see requesting physician notes for further details and justification.  Exam has shown concordant pain with facet joint loading.   ROS Otherwise per HPI.  Assessment & Plan: Visit Diagnoses:    ICD-10-CM   1. Spondylosis without myelopathy or radiculopathy, lumbar region  M47.816 XR C-ARM NO REPORT    Facet Injection    bupivacaine (MARCAINE) 0.5 % (with pres) injection 3 mL    Plan: No additional findings.   Meds & Orders:  Meds ordered this encounter  Medications  . bupivacaine (MARCAINE) 0.5 % (with pres) injection 3 mL    Orders Placed This Encounter  Procedures  . Facet Injection  . XR C-ARM NO REPORT    Follow-up: Return if symptoms worsen or fail to improve.   Procedures: No procedures performed  Lumbar Diagnostic Facet Joint Nerve Block with Fluoroscopic Guidance   Patient: Chad Schneider      Date of Birth: 09-19-1955 MRN: [id number] PCP: Gweneth Dimitri, MD      Visit Date: 06/13/2020   Universal Protocol:    Date/Time: 06/06/225:36 AM  Consent Given By: the patient  Position: PRONE  Additional Comments: Vital signs were monitored before and after the procedure. Patient was prepped and draped in the usual sterile fashion. The correct patient, procedure, and site was verified.   Injection Procedure Details:   Procedure diagnoses:  1.  Spondylosis without myelopathy or radiculopathy, lumbar region      Meds Administered:  Meds ordered this encounter  Medications  . bupivacaine (MARCAINE) 0.5 % (with pres) injection 3 mL     Laterality: Bilateral  Location/Site: L4-L5, L3 and L4 medial branches and L5-S1, L4 medial branch and L5 dorsal ramus  Needle: 5.0 in., 25 ga.  Short bevel or Quincke spinal needle  Needle Placement: Oblique pedical  Findings:   -Comments: There was excellent flow of contrast along the articular pillars without intravascular flow.  Procedure Details: The fluoroscope beam is vertically oriented in AP and then obliqued 15 to 20 degrees to the ipsilateral side of the desired nerve to achieve the "Scotty dog" appearance.  The skin over the target area of the junction of the superior articulating process and the transverse process (sacral ala if blocking the L5 dorsal rami) was locally anesthetized with a 1 ml volume of 1% Lidocaine without Epinephrine.  The spinal needle was inserted and advanced in a trajectory view down to the target.   After contact with periosteum and negative aspirate for blood and CSF, correct placement without intravascular or epidural spread was confirmed by injecting 0.5 ml. of Isovue-250.  A spot radiograph was obtained of this image.    Next, a 0.5 ml. volume of the injectate described above was injected. The needle was then redirected to the other facet joint nerves mentioned above if needed.  Prior to the procedure, the patient was given a Pain  Diary which was completed for baseline measurements.  After the procedure, the patient rated their pain every 30 minutes and will continue rating at this frequency for a total of 5 hours.  The patient has been asked to complete the Diary and return to [place] by mail, fax or hand delivered as soon as possible.   Additional Comments:  The patient tolerated the procedure well Dressing: 2 x 2 sterile gauze and Band-Aid     Post-procedure details: Patient was observed during the procedure. Post-procedure instructions were reviewed.  Patient left the clinic in stable condition.     Clinical History: MRI LUMBAR SPINE WITHOUT CONTRAST  TECHNIQUE: Multiplanar, multisequence MR imaging of the lumbar spine was performed. No intravenous contrast was administered.  COMPARISON:  Lumbar spine radiographs 05/13/2020  FINDINGS: Segmentation:  Standard.  Alignment: Bilateral L5 pars defects with 6 mm anterolisthesis of L5 on S1. Facet mediated anterolisthesis of L4 on L5 measuring 4 mm.  Vertebrae: No compression fracture or suspicious marrow lesion. Bilateral facet edema at L4-5. Minimal degenerative endplate edema at F5-7.  Conus medullaris and cauda equina: Conus extends to the T12-L1 level. Conus and cauda equina appear normal.  Paraspinal and other soft tissues: Unremarkable.  Disc levels:  Disc desiccation throughout the lumbar spine. Mild disc space narrowing at L2-3 and L4-5.  T12-L1: Negative.  L1-2: Disc bulging and mild facet and ligamentum flavum hypertrophy result in mild bilateral lateral recess stenosis without significant generalized spinal stenosis or neural foraminal stenosis.  L2-3: Circumferential disc bulging, a superimposed left paracentral disc protrusion, and mild facet and ligamentum flavum hypertrophy result in moderate spinal stenosis, moderate right and severe left lateral recess stenosis, and mild right and moderate left neural foraminal stenosis. Bilateral neural impingement is possible at this level, particularly of the left L3 nerve root in the lateral recess.  L3-4: Disc bulging eccentric to the left and mild-to-moderate facet hypertrophy result in mild left lateral recess stenosis and mild bilateral neural foraminal stenosis without significant spinal stenosis.  L4-5: Anterolisthesis with bulging uncovered disc and severe facet and ligamentum  flavum hypertrophy result in moderate spinal stenosis, mild-to-moderate right greater than left lateral recess stenosis, and mild right and moderate left neural foraminal stenosis.  L5-S1: Anterolisthesis with bulging uncovered disc and spurring at the L5 pars defects result in mild right and moderate left neural foraminal stenosis without spinal stenosis.  IMPRESSION: 1. Severe L4-5 facet arthrosis with grade 1 anterolisthesis and moderate spinal stenosis. 2. Moderate spinal stenosis and asymmetrically severe left lateral recess stenosis at L2-3. 3. Grade 1 anterolisthesis at L5-S1 due to L5 pars defects with left greater than right neural foraminal stenosis.   Electronically Signed   By: Sebastian Ache M.D.   On: 05/20/2020 11:47     Objective:  VS:  HT:    WT:   BMI:     BP:134/86  HR:66bpm  TEMP: ( )  RESP:  Physical Exam Vitals and nursing note reviewed.  Constitutional:      General: He is not in acute distress.    Appearance: Normal appearance. He is not ill-appearing.  HENT:     Head: Normocephalic and atraumatic.     Right Ear: External ear normal.     Left Ear: External ear normal.     Nose: No congestion.  Eyes:     Extraocular Movements: Extraocular movements intact.  Cardiovascular:     Rate and Rhythm: Normal rate.     Pulses: Normal pulses.  Pulmonary:  Effort: Pulmonary effort is normal. No respiratory distress.  Abdominal:     General: There is no distension.     Palpations: Abdomen is soft.  Musculoskeletal:        General: No tenderness or signs of injury.     Cervical back: Neck supple.     Right lower leg: No edema.     Left lower leg: No edema.     Comments: Patient has good distal strength without clonus. Patient somewhat slow to rise from a seated position to full extension.  There is concordant low back pain with facet loading and lumbar spine extension rotation.  There are no definitive trigger points but the patient is somewhat  tender across the lower back and PSIS.  There is no pain with hip rotation.   Skin:    Findings: No erythema or rash.  Neurological:     General: No focal deficit present.     Mental Status: He is alert and oriented to person, place, and time.     Sensory: No sensory deficit.     Motor: No weakness or abnormal muscle tone.     Coordination: Coordination normal.  Psychiatric:        Mood and Affect: Mood normal.        Behavior: Behavior normal.      Imaging: No results found.

## 2020-07-01 NOTE — Procedures (Signed)
Lumbar Diagnostic Facet Joint Nerve Block with Fluoroscopic Guidance   Patient: Chad Schneider      Date of Birth: Nov 27, 1955 MRN: [id number] PCP: Gweneth Dimitri, MD      Visit Date: 06/13/2020   Universal Protocol:    Date/Time: 06/06/225:36 AM  Consent Given By: the patient  Position: PRONE  Additional Comments: Vital signs were monitored before and after the procedure. Patient was prepped and draped in the usual sterile fashion. The correct patient, procedure, and site was verified.   Injection Procedure Details:   Procedure diagnoses:  1. Spondylosis without myelopathy or radiculopathy, lumbar region      Meds Administered:  Meds ordered this encounter  Medications  . bupivacaine (MARCAINE) 0.5 % (with pres) injection 3 mL     Laterality: Bilateral  Location/Site: L4-L5, L3 and L4 medial branches and L5-S1, L4 medial branch and L5 dorsal ramus  Needle: 5.0 in., 25 ga.  Short bevel or Quincke spinal needle  Needle Placement: Oblique pedical  Findings:   -Comments: There was excellent flow of contrast along the articular pillars without intravascular flow.  Procedure Details: The fluoroscope beam is vertically oriented in AP and then obliqued 15 to 20 degrees to the ipsilateral side of the desired nerve to achieve the "Scotty dog" appearance.  The skin over the target area of the junction of the superior articulating process and the transverse process (sacral ala if blocking the L5 dorsal rami) was locally anesthetized with a 1 ml volume of 1% Lidocaine without Epinephrine.  The spinal needle was inserted and advanced in a trajectory view down to the target.   After contact with periosteum and negative aspirate for blood and CSF, correct placement without intravascular or epidural spread was confirmed by injecting 0.5 ml. of Isovue-250.  A spot radiograph was obtained of this image.    Next, a 0.5 ml. volume of the injectate described above was injected. The needle  was then redirected to the other facet joint nerves mentioned above if needed.  Prior to the procedure, the patient was given a Pain Diary which was completed for baseline measurements.  After the procedure, the patient rated their pain every 30 minutes and will continue rating at this frequency for a total of 5 hours.  The patient has been asked to complete the Diary and return to [place] by mail, fax or hand delivered as soon as possible.   Additional Comments:  The patient tolerated the procedure well Dressing: 2 x 2 sterile gauze and Band-Aid    Post-procedure details: Patient was observed during the procedure. Post-procedure instructions were reviewed.  Patient left the clinic in stable condition.

## 2020-07-12 DIAGNOSIS — F4323 Adjustment disorder with mixed anxiety and depressed mood: Secondary | ICD-10-CM | POA: Diagnosis not present

## 2020-08-05 DIAGNOSIS — F4323 Adjustment disorder with mixed anxiety and depressed mood: Secondary | ICD-10-CM | POA: Diagnosis not present

## 2020-10-08 ENCOUNTER — Other Ambulatory Visit: Payer: Self-pay

## 2020-10-08 MED ORDER — BACLOFEN 10 MG PO TABS: ORAL_TABLET | ORAL | 0 refills | Status: DC

## 2020-11-01 DIAGNOSIS — M545 Low back pain, unspecified: Secondary | ICD-10-CM | POA: Diagnosis not present

## 2020-11-08 ENCOUNTER — Other Ambulatory Visit: Payer: Self-pay | Admitting: Orthopaedic Surgery

## 2020-11-08 NOTE — Telephone Encounter (Signed)
Ok to renew?  

## 2020-12-07 ENCOUNTER — Other Ambulatory Visit: Payer: Self-pay | Admitting: Orthopaedic Surgery

## 2020-12-09 NOTE — Telephone Encounter (Signed)
Please advise 

## 2020-12-09 NOTE — Telephone Encounter (Signed)
I believe this is one of Dr Prince Rome patients-needs to check with his new primary care physician for refills after the 30 days since his leaving

## 2020-12-10 NOTE — Telephone Encounter (Signed)
Betsy I am happy to refill his med but in the long run he needs to find a new primary care physician

## 2020-12-10 NOTE — Telephone Encounter (Signed)
I left voicemail for patient requesting return call. I advised that our physicians were refilling medications x 30 days after Dr. Prince Rome left, but then it would be policy for patients to get meds from their new PCP. Advised that Dr. Cleophas Dunker is happy to refill medication if needed, but also asked if it inadvertently was sent to [place] instead of his new PCP. Requested return call to office if we need to refill medication once more.

## 2020-12-24 DIAGNOSIS — Z125 Encounter for screening for malignant neoplasm of prostate: Secondary | ICD-10-CM | POA: Diagnosis not present

## 2020-12-24 DIAGNOSIS — R3912 Poor urinary stream: Secondary | ICD-10-CM | POA: Diagnosis not present

## 2021-01-03 DIAGNOSIS — F4323 Adjustment disorder with mixed anxiety and depressed mood: Secondary | ICD-10-CM | POA: Diagnosis not present

## 2021-02-24 DIAGNOSIS — E1159 Type 2 diabetes mellitus with other circulatory complications: Secondary | ICD-10-CM | POA: Diagnosis not present

## 2021-02-24 DIAGNOSIS — E782 Mixed hyperlipidemia: Secondary | ICD-10-CM | POA: Diagnosis not present

## 2021-02-24 DIAGNOSIS — N401 Enlarged prostate with lower urinary tract symptoms: Secondary | ICD-10-CM | POA: Diagnosis not present

## 2021-02-24 DIAGNOSIS — Z79899 Other long term (current) drug therapy: Secondary | ICD-10-CM | POA: Diagnosis not present

## 2021-02-24 DIAGNOSIS — K2100 Gastro-esophageal reflux disease with esophagitis, without bleeding: Secondary | ICD-10-CM | POA: Diagnosis not present

## 2021-02-24 DIAGNOSIS — N139 Obstructive and reflux uropathy, unspecified: Secondary | ICD-10-CM | POA: Diagnosis not present

## 2021-02-24 DIAGNOSIS — Z125 Encounter for screening for malignant neoplasm of prostate: Secondary | ICD-10-CM | POA: Diagnosis not present

## 2021-02-24 DIAGNOSIS — I10 Essential (primary) hypertension: Secondary | ICD-10-CM | POA: Diagnosis not present

## 2021-02-24 DIAGNOSIS — E1165 Type 2 diabetes mellitus with hyperglycemia: Secondary | ICD-10-CM | POA: Diagnosis not present

## 2021-02-24 DIAGNOSIS — E538 Deficiency of other specified B group vitamins: Secondary | ICD-10-CM | POA: Diagnosis not present

## 2021-03-24 DIAGNOSIS — H524 Presbyopia: Secondary | ICD-10-CM | POA: Diagnosis not present

## 2021-03-24 DIAGNOSIS — H04123 Dry eye syndrome of bilateral lacrimal glands: Secondary | ICD-10-CM | POA: Diagnosis not present

## 2021-03-24 DIAGNOSIS — H25813 Combined forms of age-related cataract, bilateral: Secondary | ICD-10-CM | POA: Diagnosis not present

## 2021-05-09 DIAGNOSIS — F4323 Adjustment disorder with mixed anxiety and depressed mood: Secondary | ICD-10-CM | POA: Diagnosis not present

## 2021-06-05 DIAGNOSIS — F4323 Adjustment disorder with mixed anxiety and depressed mood: Secondary | ICD-10-CM | POA: Diagnosis not present

## 2021-07-25 DIAGNOSIS — F4323 Adjustment disorder with mixed anxiety and depressed mood: Secondary | ICD-10-CM | POA: Diagnosis not present

## 2021-08-22 DIAGNOSIS — F4323 Adjustment disorder with mixed anxiety and depressed mood: Secondary | ICD-10-CM | POA: Diagnosis not present

## 2021-09-19 DIAGNOSIS — F4323 Adjustment disorder with mixed anxiety and depressed mood: Secondary | ICD-10-CM | POA: Diagnosis not present

## 2021-10-09 DIAGNOSIS — Z85828 Personal history of other malignant neoplasm of skin: Secondary | ICD-10-CM | POA: Diagnosis not present

## 2021-10-09 DIAGNOSIS — L718 Other rosacea: Secondary | ICD-10-CM | POA: Diagnosis not present

## 2021-10-09 DIAGNOSIS — L738 Other specified follicular disorders: Secondary | ICD-10-CM | POA: Diagnosis not present

## 2021-10-09 DIAGNOSIS — C44311 Basal cell carcinoma of skin of nose: Secondary | ICD-10-CM | POA: Diagnosis not present

## 2021-10-09 DIAGNOSIS — D485 Neoplasm of uncertain behavior of skin: Secondary | ICD-10-CM | POA: Diagnosis not present

## 2021-10-17 DIAGNOSIS — F4323 Adjustment disorder with mixed anxiety and depressed mood: Secondary | ICD-10-CM | POA: Diagnosis not present

## 2021-11-06 IMAGING — MR MR LUMBAR SPINE W/O CM
4 of 5 series · 26 of 48 positions shown · non-contrast
Comparison: Lumbar spine radiographs 05/13/2020

CLINICAL DATA: Chronic low back pain.

EXAM:
MRI LUMBAR SPINE WITHOUT CONTRAST
TECHNIQUE: Multiplanar, multisequence MR imaging of the lumbar spine was
performed. No intravenous contrast was administered.

[Series 2: T2 · sagittal · 4.0mm · 0.81mm/px · 6 of 15 slices shown (1 of 2)]
[im 1/15]
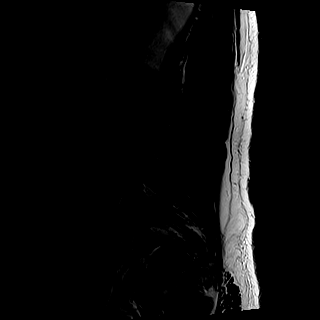
[im 3/15]
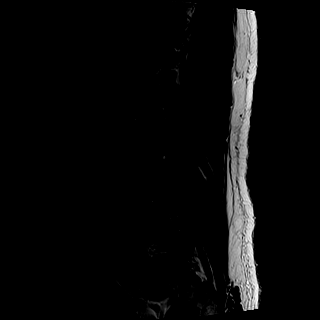
[im 6/15]
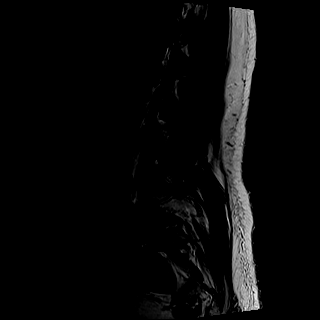
[im 9/15]
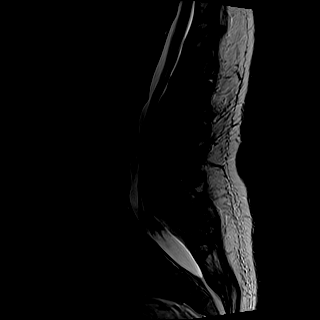
[im 12/15]
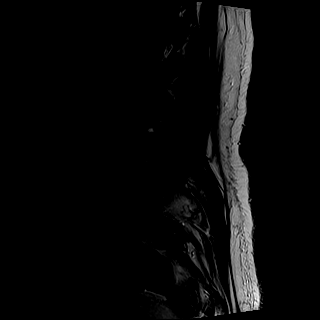
[im 15/15]
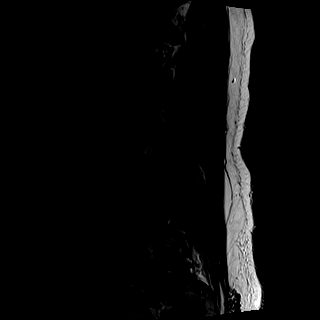

[Series 3: T1 · sagittal · 4.0mm · 0.41mm/px · 5 of 15 slices shown (1 of 2)]
[im 1/15]
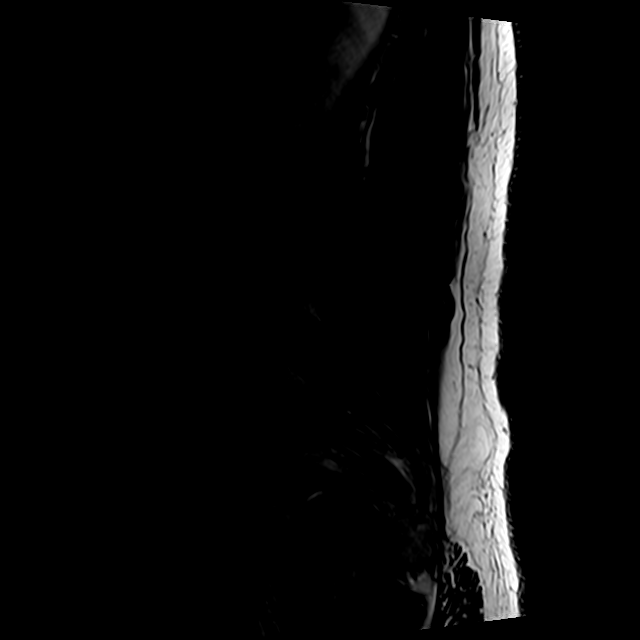
[im 4/15]
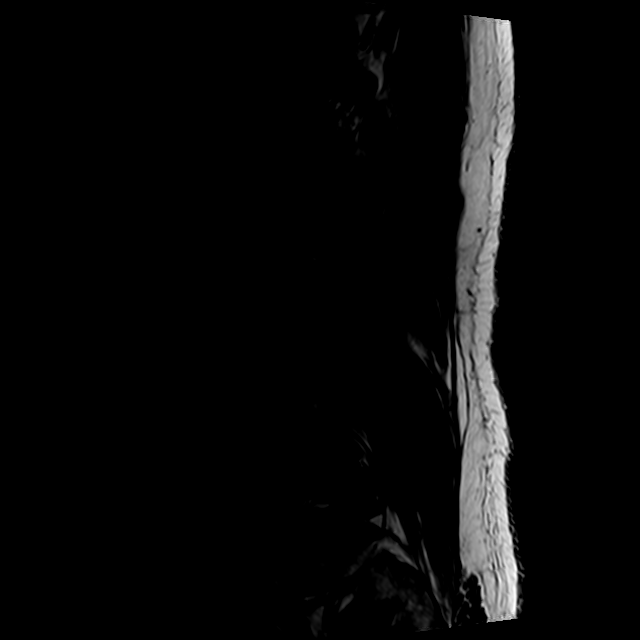
[im 8/15]
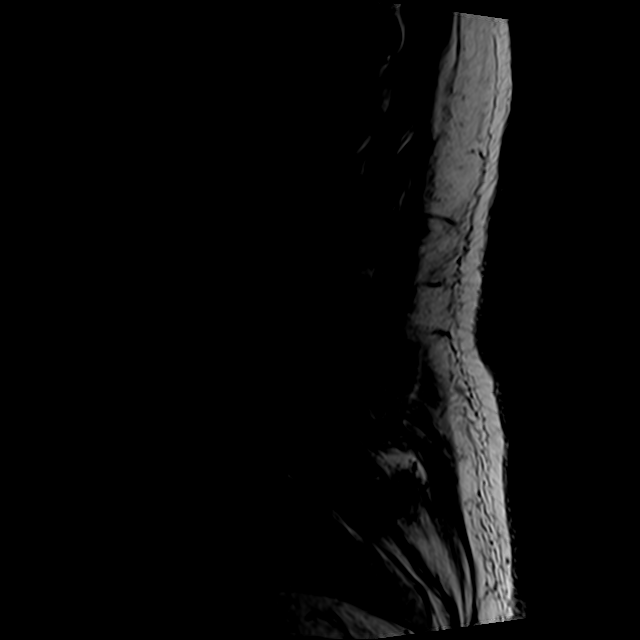
[im 11/15]
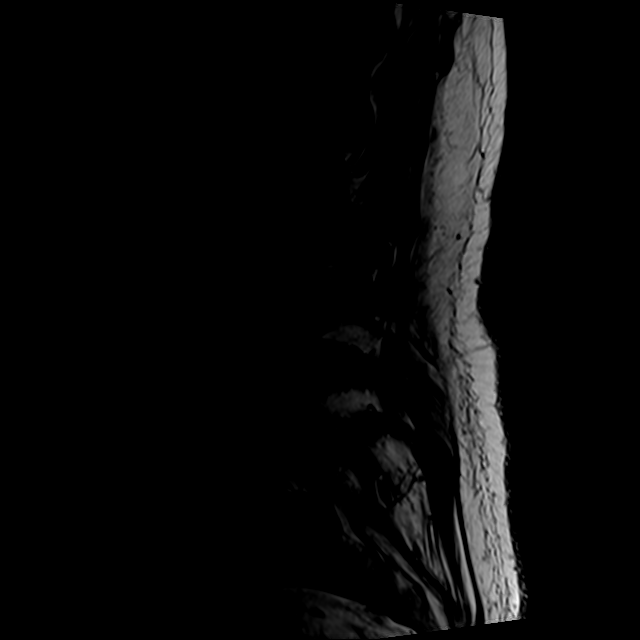
[im 15/15]
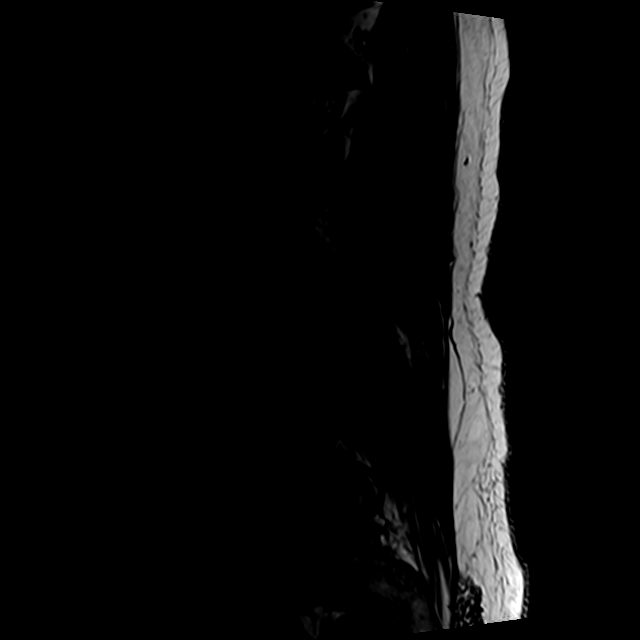

[Series 5: T2 · axial · 4.0mm · 0.78mm/px · z∈[-90,+122]mm · 10 of 43 slices shown (2 of 2)]
[im 3/43]
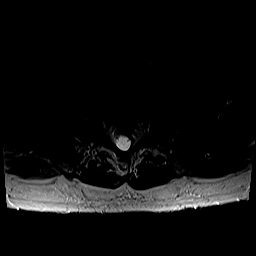
[im 6/43]
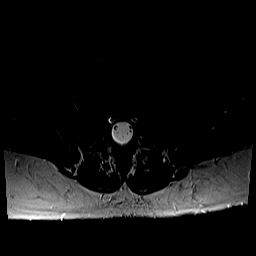
[im 9/43]
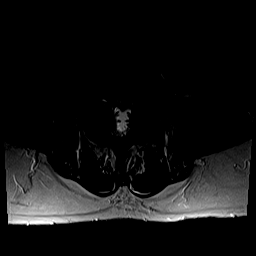
[im 15/43]
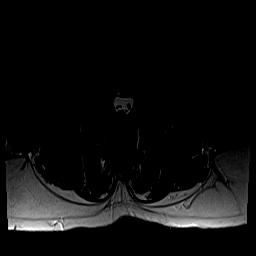
[im 20/43]
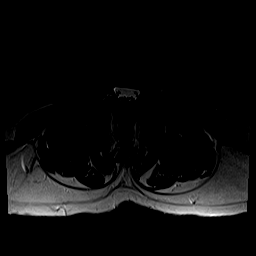
[im 23/43]
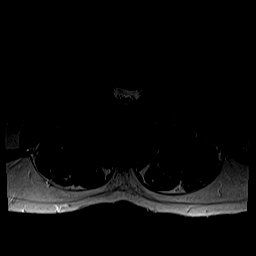
[im 26/43]
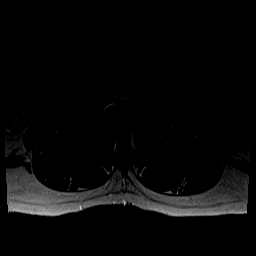
[im 31/43]
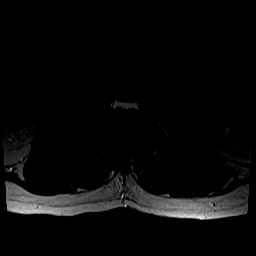
[im 37/43]
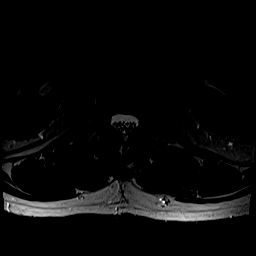
[im 43/43]
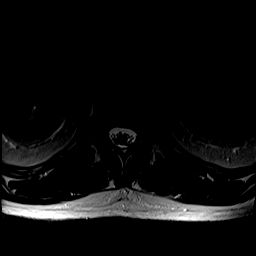

[Series 6: T1 · axial · 4.0mm · 0.39mm/px · z∈[-90,+92]mm · 5 of 43 slices shown (2 of 2)]
[im 3/43]
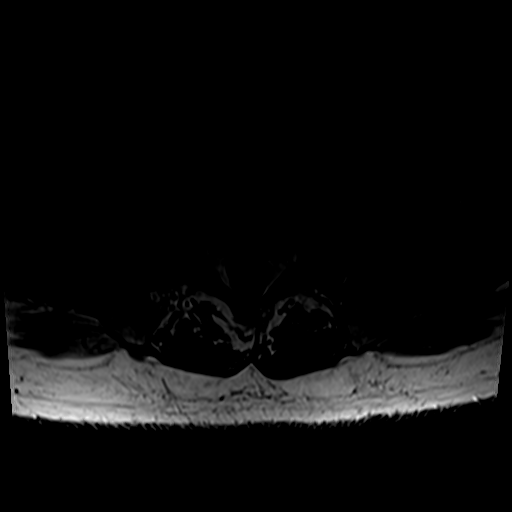
[im 6/43]
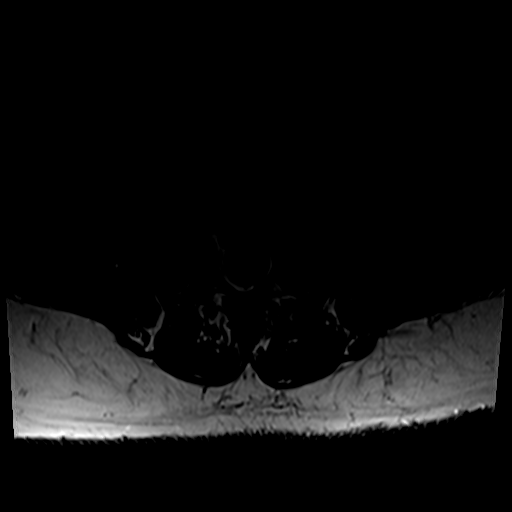
[im 9/43]
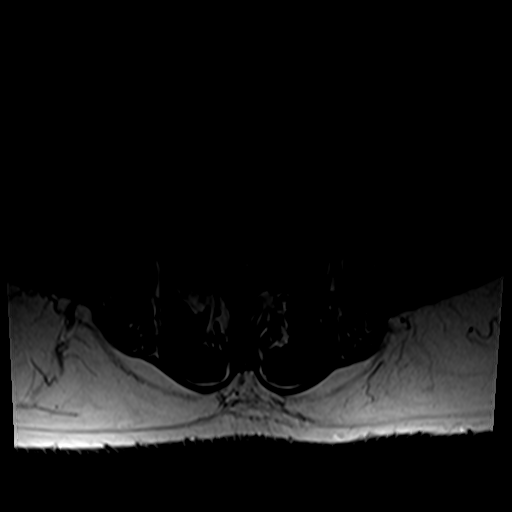
[im 23/43]
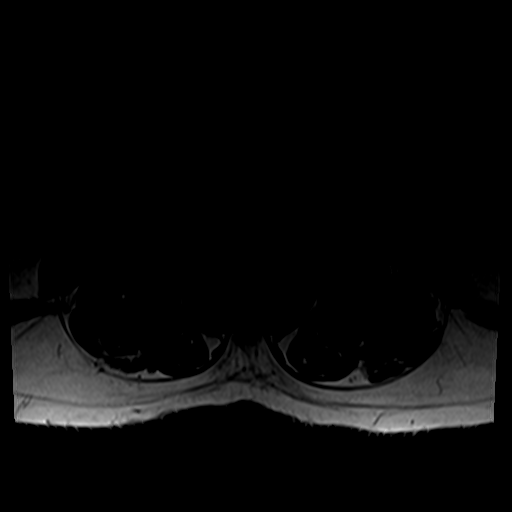
[im 37/43]
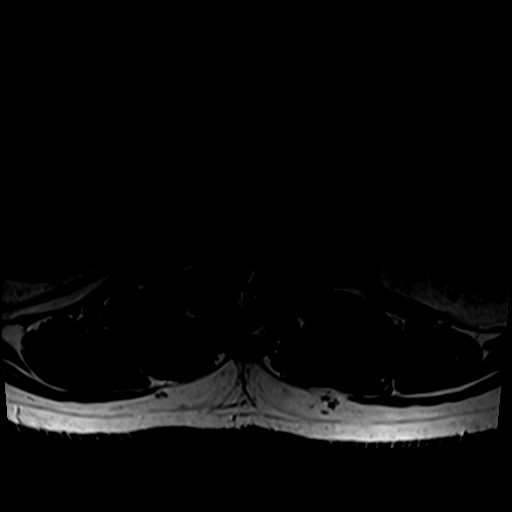

[26 of 48 positions shown; findings below may reference images not displayed]

FINDINGS: Segmentation:  Standard.

Alignment: Bilateral L5 pars defects with 6 mm anterolisthesis of L5
on S1. Facet mediated anterolisthesis of L4 on L5 measuring 4 mm.

Vertebrae: No compression fracture or suspicious marrow lesion.
Bilateral facet edema at L4-5. Minimal degenerative endplate edema
at L1-2.

Conus medullaris and cauda equina: Conus extends to the T12-L1
level. Conus and cauda equina appear normal.

Paraspinal and other soft tissues: Unremarkable.

Disc levels:

Disc desiccation throughout the lumbar spine. Mild disc space
narrowing at L2-3 and L4-5.

T12-L1: Negative.

L1-2: Disc bulging and mild facet and ligamentum flavum hypertrophy
result in mild bilateral lateral recess stenosis without significant
generalized spinal stenosis or neural foraminal stenosis.

L2-3: Circumferential disc bulging, a superimposed left paracentral
disc protrusion, and mild facet and ligamentum flavum hypertrophy
result in moderate spinal stenosis, moderate right and severe left
lateral recess stenosis, and mild right and moderate left neural
foraminal stenosis. Bilateral neural impingement is possible at this
level, particularly of the left L3 nerve root in the lateral recess.

L3-4: Disc bulging eccentric to the left and mild-to-moderate facet
hypertrophy result in mild left lateral recess stenosis and mild
bilateral neural foraminal stenosis without significant spinal
stenosis.

L4-5: Anterolisthesis with bulging uncovered disc and severe facet
and ligamentum flavum hypertrophy result in moderate spinal
stenosis, mild-to-moderate right greater than left lateral recess
stenosis, and mild right and moderate left neural foraminal
stenosis.

L5-S1: Anterolisthesis with bulging uncovered disc and spurring at
the L5 pars defects result in mild right and moderate left neural
foraminal stenosis without spinal stenosis.
IMPRESSION: 1. Severe L4-5 facet arthrosis with grade 1 anterolisthesis and
moderate spinal stenosis.
2. Moderate spinal stenosis and asymmetrically severe left lateral
recess stenosis at L2-3.
3. Grade 1 anterolisthesis at L5-S1 due to L5 pars defects with left
greater than right neural foraminal stenosis.

## 2021-11-12 DIAGNOSIS — Z85828 Personal history of other malignant neoplasm of skin: Secondary | ICD-10-CM | POA: Diagnosis not present

## 2021-11-12 DIAGNOSIS — C44311 Basal cell carcinoma of skin of nose: Secondary | ICD-10-CM | POA: Diagnosis not present

## 2021-11-13 DIAGNOSIS — F4323 Adjustment disorder with mixed anxiety and depressed mood: Secondary | ICD-10-CM | POA: Diagnosis not present

## 2021-12-12 DIAGNOSIS — F4323 Adjustment disorder with mixed anxiety and depressed mood: Secondary | ICD-10-CM | POA: Diagnosis not present

## 2022-01-08 DIAGNOSIS — F4323 Adjustment disorder with mixed anxiety and depressed mood: Secondary | ICD-10-CM | POA: Diagnosis not present

## 2022-02-06 DIAGNOSIS — F4323 Adjustment disorder with mixed anxiety and depressed mood: Secondary | ICD-10-CM | POA: Diagnosis not present

## 2022-03-05 DIAGNOSIS — F4323 Adjustment disorder with mixed anxiety and depressed mood: Secondary | ICD-10-CM | POA: Diagnosis not present

## 2022-04-02 DIAGNOSIS — F4323 Adjustment disorder with mixed anxiety and depressed mood: Secondary | ICD-10-CM | POA: Diagnosis not present

## 2022-04-13 ENCOUNTER — Telehealth: Payer: Self-pay | Admitting: Physical Medicine and Rehabilitation

## 2022-04-13 NOTE — Telephone Encounter (Signed)
Spoke with patient and he is requesting an injection. Informed patient that because it's been over a year, he would have to come for an OV. Scheduled for 04/14/22

## 2022-04-13 NOTE — Telephone Encounter (Signed)
Patient wanting to schedule an appointment for an injection

## 2022-04-14 ENCOUNTER — Ambulatory Visit (INDEPENDENT_AMBULATORY_CARE_PROVIDER_SITE_OTHER): Payer: PPO | Admitting: Physical Medicine and Rehabilitation

## 2022-04-14 ENCOUNTER — Encounter: Payer: Self-pay | Admitting: Physical Medicine and Rehabilitation

## 2022-04-14 DIAGNOSIS — M47816 Spondylosis without myelopathy or radiculopathy, lumbar region: Secondary | ICD-10-CM | POA: Diagnosis not present

## 2022-04-14 DIAGNOSIS — M48061 Spinal stenosis, lumbar region without neurogenic claudication: Secondary | ICD-10-CM | POA: Diagnosis not present

## 2022-04-14 DIAGNOSIS — M545 Low back pain, unspecified: Secondary | ICD-10-CM | POA: Diagnosis not present

## 2022-04-14 DIAGNOSIS — M4310 Spondylolisthesis, site unspecified: Secondary | ICD-10-CM | POA: Diagnosis not present

## 2022-04-14 DIAGNOSIS — G8929 Other chronic pain: Secondary | ICD-10-CM

## 2022-04-14 NOTE — Progress Notes (Signed)
Functional Pain Scale - descriptive words and definitions  Distracting (5)    Aware of pain/able to complete some ADL's but limited by pain/sleep is affected and active distractions are only slightly useful. Moderate range order  Average Pain  varies  Lower back pain that radiates into right hip

## 2022-04-14 NOTE — Progress Notes (Signed)
Chad Schneider - 67 y.o. male MRN [id number]  Date of birth: [birth year]  Office Visit Note: Visit Date: 04/14/2022 PCP: Cari Caraway, MD Referred by: Cari Caraway, MD  Subjective: Chief Complaint  Patient presents with   Lower Back - Pain   HPI: Chad Schneider is a 67 y.o. male who comes in today for evaluation of chronic, worsening and severe right sided lower back pain, right greater than left. Pain ongoing for several years and worsens with standing, he describes pain as dull and sore sensation, currently rates as 8 out of 10. Severe pain with twisting and turning. Some relief of pain with home exercise regimen, rest and use of medications. Does attend massage therapy once a month. Ongoing chiropractic treatments with Dr. Jeri Lager, attends 3-4 times a month. Lumbar MRI imaging from 2022 exhibits bilateral L5 pars defects with 6 mm anterolisthesis of L5 on S1, 4 mm anterolisthesis of L4 on L5, moderate spinal stenosis noted at this level as well. History of bilateral L4-L5 and L5-S1 facet joint injections in our office on 06/13/2020, he reports significant relief of pain with this procedure. 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 Patient denies focal weakness, numbness and tingling. No recent trauma or falls.    Review of Systems  Musculoskeletal:  Positive for back pain.  Neurological:  Negative for tingling, sensory change, focal weakness and weakness.  All other systems reviewed and are negative.  Otherwise per HPI.  Assessment & Plan: Visit Diagnoses:    ICD-10-CM   1. Chronic right-sided low back pain without sciatica  M54.50 Ambulatory referral to Physical Medicine Rehab   G89.29     2. Facet arthropathy, lumbar  M47.816 Ambulatory referral to Physical Medicine Rehab    3. Pars defect with spondylolisthesis  M43.10 Ambulatory referral to Physical Medicine Rehab    4. Spinal stenosis of lumbar region without neurogenic claudication  M48.061 Ambulatory  referral to Physical Medicine Rehab       Plan: Findings:  Chronic, worsening and severe right sided lower back pain, right greater than left. Patient continues to have severe pain despite good conservative therapies such as home exercise regimen, massage therapy, chiropractic treatments, rest and use of medications. Patients clinical presentation and exam are consistent with facet mediated pain. He does have pain with lumbar extension today. Next step is to perform diagnostic and hopefully therapeutic right L4-L5 and L5-S1 facet joint injections under fluoroscopic guidance. If good relief with facet joint injections we did discuss possibility of longer sustained relief of pain with radiofrequency ablation. If his symptoms seem more radicular in nature I did discuss possible epidural steroid injection. There is moderate spinal canal stenosis at L4-L5. If his symptoms persists surgical consult with Dr. Ileene Rubens would be beneficial to discuss treatment options. I encouraged patient to remain active to continue with home exercise regimen. We will see him back for injections. No red flag symptoms noted upon exam today.     Meds & Orders: No orders of the defined types were placed in this encounter.   Orders Placed This Encounter  Procedures   Ambulatory referral to Physical Medicine Rehab    Follow-up: Return for Right L4-L5 and L5-S1 facet joint injections.   Procedures: No procedures performed      Clinical History: CLINICAL DATA:  Chronic low back pain.   EXAM: MRI LUMBAR SPINE WITHOUT CONTRAST   TECHNIQUE: Multiplanar, multisequence MR imaging of the lumbar spine was performed. No intravenous contrast was  administered.   COMPARISON:  Lumbar spine radiographs 05/13/2020   FINDINGS: Segmentation:  Standard.   Alignment: Bilateral L5 pars defects with 6 mm anterolisthesis of L5 on S1. Facet mediated anterolisthesis of L4 on L5 measuring 4 mm.   Vertebrae: No compression  fracture or suspicious marrow lesion. Bilateral facet edema at L4-5. Minimal degenerative endplate edema at [id number].   Conus medullaris and cauda equina: Conus extends to the T12-L1 level. Conus and cauda equina appear normal.   Paraspinal and other soft tissues: Unremarkable.   Disc levels:   Disc desiccation throughout the lumbar spine. Mild disc space narrowing at L2-3 and L4-5.   T12-L1: Negative.   L1-2: Disc bulging and mild facet and ligamentum flavum hypertrophy result in mild bilateral lateral recess stenosis without significant generalized spinal stenosis or neural foraminal stenosis.   L2-3: Circumferential disc bulging, a superimposed left paracentral disc protrusion, and mild facet and ligamentum flavum hypertrophy result in moderate spinal stenosis, moderate right and severe left lateral recess stenosis, and mild right and moderate left neural foraminal stenosis. Bilateral neural impingement is possible at this level, particularly of the left L3 nerve root in the lateral recess.   L3-4: Disc bulging eccentric to the left and mild-to-moderate facet hypertrophy result in mild left lateral recess stenosis and mild bilateral neural foraminal stenosis without significant spinal stenosis.   L4-5: Anterolisthesis with bulging uncovered disc and severe facet and ligamentum flavum hypertrophy result in moderate spinal stenosis, mild-to-moderate right greater than left lateral recess stenosis, and mild right and moderate left neural foraminal stenosis.   L5-S1: Anterolisthesis with bulging uncovered disc and spurring at the L5 pars defects result in mild right and moderate left neural foraminal stenosis without spinal stenosis.   IMPRESSION: 1. Severe L4-5 facet arthrosis with grade 1 anterolisthesis and moderate spinal stenosis. 2. Moderate spinal stenosis and asymmetrically severe left lateral recess stenosis at L2-3. 3. Grade 1 anterolisthesis at L5-S1 due to L5  pars defects with left greater than right neural foraminal stenosis.     Electronically Signed   By: Logan Bores M.D.   On: 05/20/2020 11:47   He reports that he quit smoking about 44 years ago. His smoking use included cigarettes. He has never used smokeless tobacco. No results for input(s): "HGBA1C", "LABURIC" in the last 8760 hours.  Objective:  VS:  HT:    WT:   BMI:     BP:   HR: bpm  TEMP: ( )  RESP:  Physical Exam Vitals and nursing note reviewed.  HENT:     Head: Normocephalic and atraumatic.     Right Ear: External ear normal.     Left Ear: External ear normal.     Nose: Nose normal.     Mouth/Throat:     Mouth: Mucous membranes are moist.  Eyes:     Extraocular Movements: Extraocular movements intact.  Cardiovascular:     Rate and Rhythm: Normal rate.     Pulses: Normal pulses.  Pulmonary:     Effort: Pulmonary effort is normal.  Abdominal:     General: Abdomen is flat. There is no distension.  Musculoskeletal:        General: Tenderness present.     Cervical back: Normal range of motion.     Comments: Patient rises from seated position to standing without difficulty. Concordant low back pain with facet loading, lumbar spine extension and rotation. 5/5 strength noted with bilateral hip flexion, knee flexion/extension, ankle dorsiflexion/plantarflexion and EHL. No clonus noted  bilaterally. No pain upon palpation of greater trochanters. No pain with internal/external rotation of bilateral hips. Sensation intact bilaterally. Negative slump test bilaterally. Ambulates without aid, gait steady.      Skin:    General: Skin is warm and dry.     Capillary Refill: Capillary refill takes less than 2 seconds.  Neurological:     General: No focal deficit present.     Mental Status: He is alert and oriented to person, place, and time.  Psychiatric:        Mood and Affect: Mood normal.        Behavior: Behavior normal.     Ortho Exam  Imaging: No results  found.  Past Medical/Family/Surgical/Social History: Medications & Allergies reviewed per EMR, new medications updated. Patient Active Problem List   Diagnosis Date Noted   Laryngopharyngeal reflux (LPR) 11/04/2018   Nasal obstruction 11/04/2018   Obstructive sleep apnea 11/04/2018   Presbycusis of both ears 11/04/2018   Rhinitis medicamentosa 11/04/2018   Acute pain of left knee 01/01/2016   Irreducible umbilical hernia [id number]   Past Medical History:  Diagnosis Date   Hypertension    History reviewed. No pertinent family history. Past Surgical History:  Procedure Laterality Date   reset femur     UMBILICAL HERNIA REPAIR N/A 09/03/2015   Procedure: DIAGNOSTIC LAPOROSCOPY,OPEN UMBILICAL HERNIA   REPAIR;  Surgeon: Alphonsa Overall, MD;  Location: [facility];  Service: General;  Laterality: N/A;   Social History   Occupational History   Not on file  Tobacco Use   Smoking status: Former    Types: Cigarettes    Quit date: 10/19/1977    Years since quitting: 44.5   Smokeless tobacco: Never  Substance and Sexual Activity   Alcohol use: Yes   Drug use: No   Sexual activity: Yes

## 2022-04-20 ENCOUNTER — Telehealth: Payer: Self-pay

## 2022-04-20 ENCOUNTER — Other Ambulatory Visit: Payer: Self-pay | Admitting: Physical Medicine and Rehabilitation

## 2022-04-20 MED ORDER — TRAMADOL HCL 50 MG PO TABS: 50.0000 mg | ORAL_TABLET | Freq: Three times a day (TID) | ORAL | 0 refills | Status: AC | PRN

## 2022-04-20 NOTE — Telephone Encounter (Signed)
Patient is scheduled for 04/30/22 for his injection. He wants to know if he can have something for pain until then. He uses the [facility] on [address]. Please advise

## 2022-04-30 ENCOUNTER — Other Ambulatory Visit: Payer: Self-pay

## 2022-04-30 ENCOUNTER — Ambulatory Visit: Payer: PPO | Admitting: Physical Medicine and Rehabilitation

## 2022-04-30 VITALS — BP 159/96 | HR 80

## 2022-04-30 DIAGNOSIS — F4323 Adjustment disorder with mixed anxiety and depressed mood: Secondary | ICD-10-CM | POA: Diagnosis not present

## 2022-04-30 DIAGNOSIS — M47816 Spondylosis without myelopathy or radiculopathy, lumbar region: Secondary | ICD-10-CM | POA: Diagnosis not present

## 2022-04-30 MED ORDER — METHYLPREDNISOLONE ACETATE 80 MG/ML IJ SUSP
80.0000 mg | Freq: Once | INTRAMUSCULAR | Status: AC
Start: 2022-04-30 — End: 2022-04-30
  Administered 2022-04-30: 80 mg

## 2022-04-30 NOTE — Patient Instructions (Signed)

## 2022-04-30 NOTE — Progress Notes (Signed)
Functional Pain Scale - descriptive words and definitions  Distracting (5)    Aware of pain/able to complete some ADL's but limited by pain/sleep is affected and active distractions are only slightly useful. Moderate range order  Average Pain  varies   +Driver, -BT, -Dye Allergies.  Lower back pain on the right side that radiates into right leg to the thigh

## 2022-05-11 NOTE — Procedures (Signed)
Lumbar Facet Joint Intra-Articular Injection(s) with Fluoroscopic Guidance  Patient: Chad Schneider      Date of Birth: 08-05-1955 MRN: [id number] PCP: Gweneth Dimitri, MD      Visit Date: 04/30/2022   Universal Protocol:    Date/Time: 04/30/2022  Consent Given By: the patient  Position: PRONE   Additional Comments: Vital signs were monitored before and after the procedure. Patient was prepped and draped in the usual sterile fashion. The correct patient, procedure, and site was verified.   Injection Procedure Details:  Procedure Site One Meds Administered:  Meds ordered this encounter  Medications   methylPREDNISolone acetate (DEPO-MEDROL) injection 80 mg     Laterality: Right  Location/Site:  L4-L5 L5-S1  Needle size: 22 guage  Needle type: Spinal  Needle Placement: Articular  Findings:  -Comments: Excellent flow of contrast producing a partial arthrogram.  Procedure Details: The fluoroscope beam is vertically oriented in AP, and the inferior recess is visualized beneath the lower pole of the inferior apophyseal process, which represents the target point for needle insertion. When direct visualization is difficult the target point is located at the medial projection of the vertebral pedicle. The region overlying each aforementioned target is locally anesthetized with a 1 to 2 ml. volume of 1% Lidocaine without Epinephrine.   The spinal needle was inserted into each of the above mentioned facet joints using biplanar fluoroscopic guidance. A 0.25 to 0.5 ml. volume of Isovue-250 was injected and a partial facet joint arthrogram was obtained. A single spot film was obtained of the resulting arthrogram.    One to 1.25 ml of the steroid/anesthetic solution was then injected into each of the facet joints noted above.   Additional Comments:  No complications occurred Dressing: 2 x 2 sterile gauze and Band-Aid    Post-procedure details: Patient was observed during the  procedure. Post-procedure instructions were reviewed.  Patient left the clinic in stable condition.

## 2022-05-11 NOTE — Progress Notes (Signed)
Chad Schneider - 67 y.o. male MRN [id number]  Date of birth: [birth year]  Office Visit Note: Visit Date: 04/30/2022 PCP: Gweneth Dimitri, MD Referred by: Gweneth Dimitri, MD  Subjective: Chief Complaint  Patient presents with   Lower Back - Pain   HPI:  Chad Schneider is a 67 y.o. male who comes in today at the request of Ellin Goodie, FNP for planned Right  L4-5 and L5-S1 Lumbar facet/medial branch block with fluoroscopic guidance.  The patient has failed conservative care including home exercise, medications, time and activity modification.  This injection will be diagnostic and hopefully therapeutic.  Please see requesting physician notes for further details and justification.  Exam has shown concordant pain with facet joint loading.  Depending on relief would look at updated MRI of the lumbar spine for worsening potential of the stenosis.  Consider epidural injection at that point.   ROS Otherwise per HPI.  Assessment & Plan: Visit Diagnoses:    ICD-10-CM   1. Spondylosis without myelopathy or radiculopathy, lumbar region  M47.816 XR C-ARM NO REPORT    Facet Injection    methylPREDNISolone acetate (DEPO-MEDROL) injection 80 mg      Plan: No additional findings.   Meds & Orders:  Meds ordered this encounter  Medications   methylPREDNISolone acetate (DEPO-MEDROL) injection 80 mg    Orders Placed This Encounter  Procedures   Facet Injection   XR C-ARM NO REPORT    Follow-up: Return for visit to requesting provider as needed.   Procedures: No procedures performed  Lumbar Facet Joint Intra-Articular Injection(s) with Fluoroscopic Guidance  Patient: Chad Schneider      Date of Birth: [birth year] MRN: [id number] PCP: Gweneth Dimitri, MD      Visit Date: 04/30/2022   Universal Protocol:    Date/Time: 04/30/2022  Consent Given By: the patient  Position: PRONE   Additional Comments: Vital signs were monitored before and after the procedure. Patient was prepped and draped  in the usual sterile fashion. The correct patient, procedure, and site was verified.   Injection Procedure Details:  Procedure Site One Meds Administered:  Meds ordered this encounter  Medications   methylPREDNISolone acetate (DEPO-MEDROL) injection 80 mg     Laterality: Right  Location/Site:  L4-L5 L5-S1  Needle size: 22 guage  Needle type: Spinal  Needle Placement: Articular  Findings:  -Comments: Excellent flow of contrast producing a partial arthrogram.  Procedure Details: The fluoroscope beam is vertically oriented in AP, and the inferior recess is visualized beneath the lower pole of the inferior apophyseal process, which represents the target point for needle insertion. When direct visualization is difficult the target point is located at the medial projection of the vertebral pedicle. The region overlying each aforementioned target is locally anesthetized with a 1 to 2 ml. volume of 1% Lidocaine without Epinephrine.   The spinal needle was inserted into each of the above mentioned facet joints using biplanar fluoroscopic guidance. A 0.25 to 0.5 ml. volume of Isovue-250 was injected and a partial facet joint arthrogram was obtained. A single spot film was obtained of the resulting arthrogram.    One to 1.25 ml of the steroid/anesthetic solution was then injected into each of the facet joints noted above.   Additional Comments:  No complications occurred Dressing: 2 x 2 sterile gauze and Band-Aid    Post-procedure details: Patient was observed during the procedure. Post-procedure instructions were reviewed.  Patient left the clinic in stable condition.    Clinical History:  CLINICAL DATA:  Chronic low back pain.   EXAM: MRI LUMBAR SPINE WITHOUT CONTRAST   TECHNIQUE: Multiplanar, multisequence MR imaging of the lumbar spine was performed. No intravenous contrast was administered.   COMPARISON:  Lumbar spine radiographs 05/13/2020   FINDINGS: Segmentation:   Standard.   Alignment: Bilateral L5 pars defects with 6 mm anterolisthesis of L5 on S1. Facet mediated anterolisthesis of L4 on L5 measuring 4 mm.   Vertebrae: No compression fracture or suspicious marrow lesion. Bilateral facet edema at L4-5. Minimal degenerative endplate edema at O7-5.   Conus medullaris and cauda equina: Conus extends to the T12-L1 level. Conus and cauda equina appear normal.   Paraspinal and other soft tissues: Unremarkable.   Disc levels:   Disc desiccation throughout the lumbar spine. Mild disc space narrowing at L2-3 and L4-5.   T12-L1: Negative.   L1-2: Disc bulging and mild facet and ligamentum flavum hypertrophy result in mild bilateral lateral recess stenosis without significant generalized spinal stenosis or neural foraminal stenosis.   L2-3: Circumferential disc bulging, a superimposed left paracentral disc protrusion, and mild facet and ligamentum flavum hypertrophy result in moderate spinal stenosis, moderate right and severe left lateral recess stenosis, and mild right and moderate left neural foraminal stenosis. Bilateral neural impingement is possible at this level, particularly of the left L3 nerve root in the lateral recess.   L3-4: Disc bulging eccentric to the left and mild-to-moderate facet hypertrophy result in mild left lateral recess stenosis and mild bilateral neural foraminal stenosis without significant spinal stenosis.   L4-5: Anterolisthesis with bulging uncovered disc and severe facet and ligamentum flavum hypertrophy result in moderate spinal stenosis, mild-to-moderate right greater than left lateral recess stenosis, and mild right and moderate left neural foraminal stenosis.   L5-S1: Anterolisthesis with bulging uncovered disc and spurring at the L5 pars defects result in mild right and moderate left neural foraminal stenosis without spinal stenosis.   IMPRESSION: 1. Severe L4-5 facet arthrosis with grade 1  anterolisthesis and moderate spinal stenosis. 2. Moderate spinal stenosis and asymmetrically severe left lateral recess stenosis at L2-3. 3. Grade 1 anterolisthesis at L5-S1 due to L5 pars defects with left greater than right neural foraminal stenosis.     Electronically Signed   By: Sebastian Ache M.D.   On: 05/20/2020 11:47     Objective:  VS:  HT:    WT:   BMI:     BP:(!) 159/96  HR:80bpm  TEMP: ( )  RESP:  Physical Exam Vitals and nursing note reviewed.  Constitutional:      General: He is not in acute distress.    Appearance: Normal appearance. He is not ill-appearing.  HENT:     Head: Normocephalic and atraumatic.     Right Ear: External ear normal.     Left Ear: External ear normal.     Nose: No congestion.  Eyes:     Extraocular Movements: Extraocular movements intact.  Cardiovascular:     Rate and Rhythm: Normal rate.     Pulses: Normal pulses.  Pulmonary:     Effort: Pulmonary effort is normal. No respiratory distress.  Abdominal:     General: There is no distension.     Palpations: Abdomen is soft.  Musculoskeletal:        General: No tenderness or signs of injury.     Cervical back: Neck supple.     Right lower leg: No edema.     Left lower leg: No edema.  Comments: Patient has good distal strength without clonus.  Skin:    Findings: No erythema or rash.  Neurological:     General: No focal deficit present.     Mental Status: He is alert and oriented to person, place, and time.     Sensory: No sensory deficit.     Motor: No weakness or abnormal muscle tone.     Coordination: Coordination normal.  Psychiatric:        Mood and Affect: Mood normal.        Behavior: Behavior normal.      Imaging: No results found.

## 2022-05-28 DIAGNOSIS — F4323 Adjustment disorder with mixed anxiety and depressed mood: Secondary | ICD-10-CM | POA: Diagnosis not present

## 2022-06-26 DIAGNOSIS — F4323 Adjustment disorder with mixed anxiety and depressed mood: Secondary | ICD-10-CM | POA: Diagnosis not present

## 2022-07-24 DIAGNOSIS — F4323 Adjustment disorder with mixed anxiety and depressed mood: Secondary | ICD-10-CM | POA: Diagnosis not present

## 2022-09-16 DIAGNOSIS — Z0000 Encounter for general adult medical examination without abnormal findings: Secondary | ICD-10-CM | POA: Diagnosis not present

## 2022-10-27 DIAGNOSIS — N401 Enlarged prostate with lower urinary tract symptoms: Secondary | ICD-10-CM | POA: Diagnosis not present

## 2022-10-27 DIAGNOSIS — E119 Type 2 diabetes mellitus without complications: Secondary | ICD-10-CM | POA: Diagnosis not present

## 2022-10-27 DIAGNOSIS — Z125 Encounter for screening for malignant neoplasm of prostate: Secondary | ICD-10-CM | POA: Diagnosis not present

## 2022-10-27 DIAGNOSIS — E1159 Type 2 diabetes mellitus with other circulatory complications: Secondary | ICD-10-CM | POA: Diagnosis not present

## 2022-10-27 DIAGNOSIS — I10 Essential (primary) hypertension: Secondary | ICD-10-CM | POA: Diagnosis not present

## 2022-10-27 DIAGNOSIS — Z5320 Procedure and treatment not carried out because of patient's decision for unspecified reasons: Secondary | ICD-10-CM | POA: Diagnosis not present

## 2022-10-27 DIAGNOSIS — E782 Mixed hyperlipidemia: Secondary | ICD-10-CM | POA: Diagnosis not present

## 2023-08-27 DIAGNOSIS — F4323 Adjustment disorder with mixed anxiety and depressed mood: Secondary | ICD-10-CM | POA: Diagnosis not present

## 2023-09-01 ENCOUNTER — Ambulatory Visit: Admitting: Orthopedic Surgery

## 2023-11-29 ENCOUNTER — Encounter: Payer: Self-pay | Admitting: Radiology

## 2023-12-28 DIAGNOSIS — Z125 Encounter for screening for malignant neoplasm of prostate: Secondary | ICD-10-CM | POA: Diagnosis not present

## 2023-12-28 DIAGNOSIS — M65332 Trigger finger, left middle finger: Secondary | ICD-10-CM | POA: Diagnosis not present

## 2023-12-28 DIAGNOSIS — Z6836 Body mass index (BMI) 36.0-36.9, adult: Secondary | ICD-10-CM | POA: Diagnosis not present

## 2023-12-28 DIAGNOSIS — Z1331 Encounter for screening for depression: Secondary | ICD-10-CM | POA: Diagnosis not present

## 2023-12-28 DIAGNOSIS — Z0001 Encounter for general adult medical examination with abnormal findings: Secondary | ICD-10-CM | POA: Diagnosis not present

## 2023-12-28 DIAGNOSIS — N139 Obstructive and reflux uropathy, unspecified: Secondary | ICD-10-CM | POA: Diagnosis not present

## 2023-12-28 DIAGNOSIS — F5102 Adjustment insomnia: Secondary | ICD-10-CM | POA: Diagnosis not present

## 2023-12-28 DIAGNOSIS — F1011 Alcohol abuse, in remission: Secondary | ICD-10-CM | POA: Diagnosis not present

## 2023-12-28 DIAGNOSIS — M1812 Unilateral primary osteoarthritis of first carpometacarpal joint, left hand: Secondary | ICD-10-CM | POA: Diagnosis not present

## 2023-12-28 DIAGNOSIS — I10 Essential (primary) hypertension: Secondary | ICD-10-CM | POA: Diagnosis not present

## 2023-12-28 DIAGNOSIS — E782 Mixed hyperlipidemia: Secondary | ICD-10-CM | POA: Diagnosis not present

## 2023-12-28 DIAGNOSIS — G4730 Sleep apnea, unspecified: Secondary | ICD-10-CM | POA: Diagnosis not present
